# Patient Record
Sex: Male | Born: 1975 | Race: White | Hispanic: No | Marital: Married | Smoking: Never smoker
Health system: Southern US, Community
[De-identification: ages and names within clinical notes are randomized; demographics above are authoritative.]

## PROBLEM LIST (undated history)

## (undated) DIAGNOSIS — Q2381 Bicuspid aortic valve: Secondary | ICD-10-CM

## (undated) DIAGNOSIS — R011 Cardiac murmur, unspecified: Secondary | ICD-10-CM

## (undated) DIAGNOSIS — K37 Unspecified appendicitis: Secondary | ICD-10-CM

## (undated) DIAGNOSIS — Q231 Congenital insufficiency of aortic valve: Secondary | ICD-10-CM

## (undated) DIAGNOSIS — D649 Anemia, unspecified: Secondary | ICD-10-CM

---

## 2017-07-14 ENCOUNTER — Encounter: Payer: Self-pay | Admitting: Emergency Medicine

## 2017-07-14 ENCOUNTER — Other Ambulatory Visit: Payer: Self-pay

## 2017-07-14 DIAGNOSIS — A419 Sepsis, unspecified organism: Secondary | ICD-10-CM | POA: Diagnosis not present

## 2017-07-14 DIAGNOSIS — K3533 Acute appendicitis with perforation and localized peritonitis, with abscess: Secondary | ICD-10-CM | POA: Diagnosis present

## 2017-07-14 DIAGNOSIS — R1084 Generalized abdominal pain: Secondary | ICD-10-CM | POA: Diagnosis not present

## 2017-07-14 DIAGNOSIS — Q231 Congenital insufficiency of aortic valve: Secondary | ICD-10-CM

## 2017-07-14 DIAGNOSIS — I959 Hypotension, unspecified: Secondary | ICD-10-CM | POA: Diagnosis present

## 2017-07-14 LAB — CBC
HEMATOCRIT: 42.6 % (ref 40.0–52.0)
HEMOGLOBIN: 14.3 g/dL (ref 13.0–18.0)
MCH: 29.4 pg (ref 26.0–34.0)
MCHC: 33.6 g/dL (ref 32.0–36.0)
MCV: 87.7 fL (ref 80.0–100.0)
PLATELETS: 266 10*3/uL (ref 150–440)
RBC: 4.86 MIL/uL (ref 4.40–5.90)
RDW: 13 % (ref 11.5–14.5)
WBC: 10.4 10*3/uL (ref 3.8–10.6)

## 2017-07-14 NOTE — ED Triage Notes (Signed)
Patient ambulatory to triage with steady gait, without difficulty or distress noted; pt reports right sided abd pain x 4-5 days accomp by nausea

## 2017-07-15 ENCOUNTER — Inpatient Hospital Stay: Payer: BLUE CROSS/BLUE SHIELD

## 2017-07-15 ENCOUNTER — Emergency Department: Payer: BLUE CROSS/BLUE SHIELD

## 2017-07-15 ENCOUNTER — Inpatient Hospital Stay
Admission: EM | Admit: 2017-07-15 | Discharge: 2017-07-18 | DRG: 871 | Disposition: A | Discharge status: Home / Self Care | Payer: BLUE CROSS/BLUE SHIELD | Attending: Surgery | Admitting: Surgery

## 2017-07-15 ENCOUNTER — Other Ambulatory Visit: Payer: Self-pay

## 2017-07-15 DIAGNOSIS — A419 Sepsis, unspecified organism: Secondary | ICD-10-CM | POA: Diagnosis present

## 2017-07-15 DIAGNOSIS — Q231 Congenital insufficiency of aortic valve: Secondary | ICD-10-CM | POA: Diagnosis not present

## 2017-07-15 DIAGNOSIS — I959 Hypotension, unspecified: Secondary | ICD-10-CM | POA: Diagnosis present

## 2017-07-15 DIAGNOSIS — K3532 Acute appendicitis with perforation and localized peritonitis, without abscess: Secondary | ICD-10-CM | POA: Diagnosis present

## 2017-07-15 DIAGNOSIS — K3533 Acute appendicitis with perforation and localized peritonitis, with abscess: Secondary | ICD-10-CM

## 2017-07-15 DIAGNOSIS — R1084 Generalized abdominal pain: Secondary | ICD-10-CM | POA: Diagnosis present

## 2017-07-15 HISTORY — DX: Congenital insufficiency of aortic valve: Q23.1

## 2017-07-15 HISTORY — DX: Bicuspid aortic valve: Q23.81

## 2017-07-15 LAB — COMPREHENSIVE METABOLIC PANEL
ALT: 18 U/L (ref 17–63)
AST: 26 U/L (ref 15–41)
Albumin: 4.2 g/dL (ref 3.5–5.0)
Alkaline Phosphatase: 62 U/L (ref 38–126)
Anion gap: 10 (ref 5–15)
BUN: 11 mg/dL (ref 6–20)
CHLORIDE: 103 mmol/L (ref 101–111)
CO2: 26 mmol/L (ref 22–32)
Calcium: 9.5 mg/dL (ref 8.9–10.3)
Creatinine, Ser: 0.99 mg/dL (ref 0.61–1.24)
GFR calc Af Amer: >60 mL/min (ref >60)
GFR calc non Af Amer: >60 mL/min (ref >60)
Glucose, Bld: 99 mg/dL (ref 65–99)
POTASSIUM: 4 mmol/L (ref 3.5–5.1)
SODIUM: 139 mmol/L (ref 135–145)
Total Bilirubin: 0.8 mg/dL (ref 0.3–1.2)
Total Protein: 7.8 g/dL (ref 6.5–8.1)

## 2017-07-15 LAB — URINALYSIS, COMPLETE (UACMP) WITH MICROSCOPIC
BACTERIA UA: NONE SEEN
Bilirubin Urine: NEGATIVE
GLUCOSE, UA: NEGATIVE mg/dL
HGB URINE DIPSTICK: NEGATIVE
KETONES UR: 5 mg/dL — AB
Leukocytes, UA: NEGATIVE
NITRITE: NEGATIVE
PROTEIN: NEGATIVE mg/dL
RBC / HPF: NONE SEEN RBC/hpf (ref 0–5)
Specific Gravity, Urine: 1.011 (ref 1.005–1.030)
Squamous Epithelial / LPF: NONE SEEN
WBC UA: NONE SEEN WBC/hpf (ref 0–5)
pH: 6 (ref 5.0–8.0)

## 2017-07-15 LAB — LIPASE, BLOOD: LIPASE: 24 U/L (ref 11–51)

## 2017-07-15 MED ORDER — KETOROLAC TROMETHAMINE 30 MG/ML IJ SOLN
30.0000 mg | Freq: Four times a day (QID) | INTRAMUSCULAR | Status: DC
  Administered 2017-07-15 – 2017-07-18 (×13): 30 mg via INTRAVENOUS
  Filled 2017-07-15 (×13): qty 1

## 2017-07-15 MED ORDER — MORPHINE SULFATE (PF) 2 MG/ML IV SOLN: 2.0000 mg | Freq: Once | INTRAVENOUS | Status: DC

## 2017-07-15 MED ORDER — SODIUM CHLORIDE 0.9% FLUSH
10.0000 mL | Freq: Three times a day (TID) | INTRAVENOUS | Status: DC
  Administered 2017-07-15 – 2017-07-18 (×8): 10 mL

## 2017-07-15 MED ORDER — FENTANYL CITRATE (PF) 100 MCG/2ML IJ SOLN
INTRAMUSCULAR | Status: AC | PRN
  Administered 2017-07-15 (×2): 25 ug via INTRAVENOUS
  Administered 2017-07-15: 50 ug via INTRAVENOUS

## 2017-07-15 MED ORDER — MIDAZOLAM HCL 5 MG/5ML IJ SOLN
INTRAMUSCULAR | Status: AC
  Filled 2017-07-15: qty 5

## 2017-07-15 MED ORDER — PANTOPRAZOLE SODIUM 40 MG IV SOLR
40.0000 mg | Freq: Every day | INTRAVENOUS | Status: DC
  Administered 2017-07-15: 40 mg via INTRAVENOUS
  Filled 2017-07-15: qty 40

## 2017-07-15 MED ORDER — HYDROMORPHONE HCL 1 MG/ML IJ SOLN
0.5000 mg | INTRAMUSCULAR | Status: DC | PRN
  Administered 2017-07-15: 0.5 mg via INTRAVENOUS
  Filled 2017-07-15: qty 0.5

## 2017-07-15 MED ORDER — MIDAZOLAM HCL 5 MG/5ML IJ SOLN
INTRAMUSCULAR | Status: AC | PRN
  Administered 2017-07-15 (×4): 1 mg via INTRAVENOUS

## 2017-07-15 MED ORDER — ACETAMINOPHEN 500 MG PO TABS
1000.0000 mg | ORAL_TABLET | Freq: Four times a day (QID) | ORAL | Status: DC | PRN
  Administered 2017-07-15 – 2017-07-16 (×3): 1000 mg via ORAL
  Filled 2017-07-15 (×4): qty 2

## 2017-07-15 MED ORDER — ONDANSETRON HCL 4 MG/2ML IJ SOLN
4.0000 mg | Freq: Four times a day (QID) | INTRAMUSCULAR | Status: DC | PRN
  Administered 2017-07-15 – 2017-07-16 (×2): 4 mg via INTRAVENOUS
  Filled 2017-07-15 (×2): qty 2

## 2017-07-15 MED ORDER — FENTANYL CITRATE (PF) 100 MCG/2ML IJ SOLN
INTRAMUSCULAR | Status: AC
  Filled 2017-07-15: qty 4

## 2017-07-15 MED ORDER — LACTATED RINGERS IV SOLN
125.0000 mL/h | INTRAVENOUS | Status: DC
  Administered 2017-07-15: 125 mL/h via INTRAVENOUS

## 2017-07-15 MED ORDER — ENOXAPARIN SODIUM 40 MG/0.4ML ~~LOC~~ SOLN
40.0000 mg | SUBCUTANEOUS | Status: DC
  Administered 2017-07-16 – 2017-07-17 (×3): 40 mg via SUBCUTANEOUS
  Filled 2017-07-15 (×3): qty 0.4

## 2017-07-15 MED ORDER — LACTATED RINGERS IV BOLUS (SEPSIS)
1000.0000 mL | Freq: Once | INTRAVENOUS | Status: AC
  Administered 2017-07-15: 1000 mL via INTRAVENOUS

## 2017-07-15 MED ORDER — SODIUM CHLORIDE 0.9% FLUSH: 10.0000 mL | Freq: Three times a day (TID) | INTRAVENOUS | Status: DC

## 2017-07-15 MED ORDER — LIDOCAINE HCL (PF) 1 % IJ SOLN
INTRAMUSCULAR | Status: AC | PRN
  Administered 2017-07-15: 8 mL

## 2017-07-15 MED ORDER — ONDANSETRON HCL 4 MG/2ML IJ SOLN
4.0000 mg | Freq: Once | INTRAMUSCULAR | Status: DC
Start: 2017-07-15 — End: 2017-07-18

## 2017-07-15 MED ORDER — ONDANSETRON 4 MG PO TBDP: 4.0000 mg | ORAL_TABLET | Freq: Four times a day (QID) | ORAL | Status: DC | PRN

## 2017-07-15 MED ORDER — SODIUM CHLORIDE 0.9 % IV SOLN
INTRAVENOUS | Status: DC
  Administered 2017-07-15 – 2017-07-16 (×2): via INTRAVENOUS

## 2017-07-15 MED ORDER — IOPAMIDOL (ISOVUE-300) INJECTION 61%
100.0000 mL | Freq: Once | INTRAVENOUS | Status: AC | PRN
  Administered 2017-07-15: 100 mL via INTRAVENOUS

## 2017-07-15 MED ORDER — PIPERACILLIN-TAZOBACTAM 3.375 G IVPB
3.3750 g | Freq: Three times a day (TID) | INTRAVENOUS | Status: DC
  Administered 2017-07-15 – 2017-07-18 (×9): 3.375 g via INTRAVENOUS
  Filled 2017-07-15 (×10): qty 50

## 2017-07-15 MED ORDER — PIPERACILLIN-TAZOBACTAM 3.375 G IVPB 30 MIN
3.3750 g | Freq: Once | INTRAVENOUS | Status: AC
  Administered 2017-07-15: 3.375 g via INTRAVENOUS
  Filled 2017-07-15: qty 50

## 2017-07-15 NOTE — ED Provider Notes (Signed)
[facility] Emergency Department Provider Note   First MD Initiated Contact with Patient 07/15/17 [phone]     (approximate)  I have reviewed the triage vital signs and the nursing notes.   HISTORY  Chief Complaint Abdominal Pain    HPI Edward Kaufman is a 42 y.o. male to the emergency department 1 week history of right side abdominal pain associate with nausea however no vomiting.  Patient denies any fever or diarrhea.  Patient denies any urinary symptoms no dysuria hematuria increased frequency or urgency.  Patient denies any constipation.  Patient states that his current pain score 6 out of 10.  Patient denies any aggravating or alleviating factors for his pain.  Patient denies any previous abdominal surgery   Past medical history None There are no active problems to display for this patient.   Past surgical history None  Prior to Admission medications   Not on File    Allergies Patient has no known allergies.  No family history on file.  Social History Social History   Tobacco Use  . Smoking status: Never Smoker  . Smokeless tobacco: Never Used  Substance Use Topics  . Alcohol use: Not on file  . Drug use: Not on file    Review of Systems Constitutional: No fever/chills Eyes: No visual changes. ENT: No sore throat. Cardiovascular: Denies chest pain. Respiratory: Denies shortness of breath. Gastrointestinal: Positive for abdominal pain and nausea Genitourinary: Negative for dysuria. Musculoskeletal: Negative for neck pain.  Negative for back pain. Integumentary: Negative for rash. Neurological: Negative for headaches, focal weakness or numbness.   ____________________________________________   PHYSICAL EXAM:  VITAL SIGNS: ED Triage Vitals  Enc Vitals Group     BP 07/14/17 2348 131/73     Pulse Rate 07/14/17 2348 76     Resp 07/14/17 2348 18     Temp 07/14/17 2348 99.2 F (37.3 C)     Temp Source 07/14/17 2348 Oral      SpO2 07/14/17 2348 99 %     Weight 07/14/17 2347 99.8 kg (220 lb)     Height 07/14/17 2347 1.88 m (6\' 2" )     Head Circumference --      Peak Flow --      Pain Score 07/14/17 2347 6     Pain Loc --      Pain Edu? --      Excl. in GC? --     Constitutional: Alert and oriented. Well appearing and in no acute distress. Eyes: Conjunctivae are normal.  Head: Atraumatic. Mouth/Throat: Mucous membranes are moist. Oropharynx non-erythematous. Neck: No stridor.   Cardiovascular: Normal rate, regular rhythm. Good peripheral circulation. Grossly normal heart sounds. Respiratory: Normal respiratory effort.  No retractions. Lungs CTAB. Gastrointestinal: Right upper quadrant/right lower quadrant tenderness to palpation.  No distention.  Musculoskeletal: No lower extremity tenderness nor edema. No gross deformities of extremities. Neurologic:  Normal speech and language. No gross focal neurologic deficits are appreciated.  Skin:  Skin is warm, dry and intact. No rash noted. Psychiatric: Mood and affect are normal. Speech and behavior are normal. ____________________________________________   LABS (all labs ordered are listed, but only abnormal results are displayed)  Labs Reviewed  URINALYSIS, COMPLETE (UACMP) WITH MICROSCOPIC - Abnormal; Notable for the following components:      Result Value   Color, Urine YELLOW (*)    APPearance CLEAR (*)    Ketones, ur 5 (*)    All other components within normal limits  LIPASE,  BLOOD  COMPREHENSIVE METABOLIC PANEL  CBC    RADIOLOGY I, [place] Consuelo Suthers, personally viewed and evaluated these images (plain radiographs) as part of my medical decision making, as well as reviewing the written report by the radiologist.    Official radiology report(s): Ct Abdomen Pelvis W Contrast  Result Date: 07/15/2017 CLINICAL DATA:  Acute onset of generalized abdominal pain and nausea. Chills. EXAM: CT ABDOMEN AND PELVIS WITH CONTRAST TECHNIQUE: Multidetector CT  imaging of the abdomen and pelvis was performed using the standard protocol following bolus administration of intravenous contrast. CONTRAST:  ISOVUE-300 IOPAMIDOL (ISOVUE-300) INJECTION 61% COMPARISON:  None. FINDINGS: Lower chest: The visualized lung bases are grossly clear. The visualized portions of the mediastinum are unremarkable. Hepatobiliary: The liver is unremarkable in appearance. The gallbladder is unremarkable in appearance. The common bile duct remains normal in caliber. Pancreas: The pancreas is within normal limits. Spleen: The spleen is unremarkable in appearance. Adrenals/Urinary Tract: The adrenal glands are unremarkable in appearance. The kidneys are within normal limits. There is no evidence of hydronephrosis. No renal or ureteral stones are identified. No perinephric stranding is seen. Stomach/Bowel: There is a focal 5.0 x 3.6 x 3.0 cm peripherally enhancing abscess at the upper right hemipelvis, with diffuse surrounding soft tissue inflammation. This most likely reflects a perforated appendicitis. No free air is seen. An appendicolith is suggested adjacent to the collection, measuring 4 mm in size. The appendix is not well characterized due to the abscess. Appendix: Location: Upper right hemipelvis, medial to the cecum. Diameter: Not well seen. Appendicolith: Yes Mucosal hyper-enhancement: Yes Extraluminal gas: No Periappendiceal collection: Yes The colon is unremarkable in appearance. Trace free fluid is noted within the pelvis. The small bowel is grossly unremarkable. The stomach is within normal limits. Vascular/Lymphatic: The abdominal aorta is unremarkable in appearance. The inferior vena cava is grossly unremarkable. No retroperitoneal lymphadenopathy is seen. No pelvic sidewall lymphadenopathy is identified. Reproductive: The bladder is moderately distended and grossly unremarkable. The prostate remains normal in size. Other: No additional soft tissue abnormalities are seen.  Musculoskeletal: No acute osseous abnormalities are identified. The visualized musculature is unremarkable in appearance. IMPRESSION: 5.0 x 3.6 x 3.0 cm peripherally enhancing abscess in the upper right hemipelvis, with diffuse surrounding soft tissue inflammation. This most likely reflects a perforated appendicitis. Adjacent appendicolith suggested, measuring 4 mm in size. No free intraperitoneal air seen. Critical Value/emergent results were called by telephone at the time of interpretation on 07/15/2017 at 5:05 am to Dr. Bayard Males, who verbally acknowledged these results. Electronically Signed   By: Roanna Raider M.D.   On: 07/15/2017 05:06     Procedures   ____________________________________________   INITIAL IMPRESSION / ASSESSMENT AND PLAN / ED COURSE  As part of my medical decision making, I reviewed the following data within the electronic MEDICAL RECORD [id number] year old male presented with above-stated history and physical exam of abdominal pain times 1 week.  Concern for possible intra-abdominal pathology including appendicitis cholelithiasis or cholecystitis and as such CT scan of the abdomen was performed CT scan findings consistent with perforated appendicitis with abscess.  Patient discussed with Dr. Cherly Hensen radiologist as well as Dr. Aleen Campi general surgery. ____________________________________________  FINAL CLINICAL IMPRESSION(S) / ED DIAGNOSES  Final diagnoses:  Perforated appendicitis     MEDICATIONS GIVEN DURING THIS VISIT:  Medications  piperacillin-tazobactam (ZOSYN) IVPB 3.375 g (3.375 g Intravenous New Bag/Given 07/15/17 0522)  iopamidol (ISOVUE-300) 61 % injection 100 mL (100 mLs Intravenous Contrast Given 07/15/17 0441)  ED Discharge Orders    None       Note:  This document was prepared using Dragon voice recognition software and may include unintentional dictation errors.    Darci Current, MD 07/15/17 [phone]

## 2017-07-15 NOTE — ED Notes (Signed)
Pt in with initially diffuse abd pain that is now 6/10 right sided aching. Endorses nausea, denies vomiting. Reports having chills and feeling warm but did not measure temperature at home.

## 2017-07-15 NOTE — H&P (Signed)
Date of Admission:  07/15/2017  Reason for Admission:  Perforated appendicitis  History of Present Illness: Edward Kaufman is a 42 y.o. male who presents with a 6 day history of abdominal pain.  He initially started having pain on Friday 2/1 and he thought he may have a hernia or pulled a groin muscle.  He was able to go for a 5-6 mile run the next day.  However by 2/4 he was not able to run due to the pain and then on 2/6, he started having chills and nausea.  Denies any vomiting or fevers.  The pain has been vaguely diffuse at first and now more localized to the right lower quadrant.  He has been able to eat.  Nothing at home improved the pain.  In the ED, workup reveals normal labs overall with WBC 10.4, but CT does show perforated appendicitis with a 5 x 3.6 x 3 cm abscess.  He does have a history of bicuspid aortic valve and is set to see an internist for continued management next month.  He had a stress echo about 1.5 years ago which per patient was normal.  He just moved to the area.  Past Medical History: Past Medical History:  Diagnosis Date  . Bicuspid aortic valve      Past Surgical History: --None  Home Medications: Prior to Admission medications   None    Allergies: No Known Allergies  Social History:  reports that  has never smoked. he has never used smokeless tobacco. He reports that he does not drink alcohol or use drugs.   Family History: Family History  Problem Relation Age of Onset  . Healthy Neg Hx     Review of Systems: Review of Systems  Constitutional: Positive for chills. Negative for fever.  HENT: Negative for hearing loss.   Respiratory: Negative for shortness of breath.   Cardiovascular: Negative for chest pain.  Gastrointestinal: Positive for abdominal pain and nausea. Negative for constipation, diarrhea and vomiting.  Genitourinary: Negative for dysuria.  Musculoskeletal: Negative for myalgias.  Skin: Negative for rash.  Neurological: Negative  for dizziness.  Psychiatric/Behavioral: Negative for depression.  All other systems reviewed and are negative.   Physical Exam BP 121/81   Pulse 64   Temp 98.6 F (37 C) (Oral)   Resp 18   Ht 6\' 2"  (1.88 m)   Wt 99.8 kg (220 lb)   SpO2 100%   BMI 28.25 kg/m  CONSTITUTIONAL: No acute distress HEENT:  Normocephalic, atraumatic, extraocular motion intact. NECK: Trachea is midline, and there is no jugular venous distension.  RESPIRATORY:  Lungs are clear, and breath sounds are equal bilaterally. Normal respiratory effort without pathologic use of accessory muscles. CARDIOVASCULAR: Heart is regular without murmurs, gallops, or rubs. GI: The abdomen is soft, non-distended, tender to palpation in the right lower quadrant. No peritoneal signs. There were no palpable masses.  MUSCULOSKELETAL:  Normal muscle strength and tone in all four extremities.  No peripheral edema or cyanosis. SKIN: Skin turgor is normal. There are no pathologic skin lesions.  NEUROLOGIC:  Motor and sensation is grossly normal.  Cranial nerves are grossly intact. PSYCH:  Alert and oriented to person, place and time. Affect is normal.  Laboratory Analysis: Results for orders placed or performed during the hospital encounter of 07/15/17 (from the past 24 hour(s))  Lipase, blood     Status: None   Collection Time: 07/14/17 11:43 PM  Result Value Ref Range   Lipase 24 11 - 51  U/L  Comprehensive metabolic panel     Status: None   Collection Time: 07/14/17 11:43 PM  Result Value Ref Range   Sodium 139 135 - 145 mmol/L   Potassium 4.0 3.5 - 5.1 mmol/L   Chloride 103 101 - 111 mmol/L   CO2 26 22 - 32 mmol/L   Glucose, Bld 99 65 - 99 mg/dL   BUN 11 6 - 20 mg/dL   Creatinine, Ser 9.60 0.61 - 1.24 mg/dL   Calcium 9.5 8.9 - 45.4 mg/dL   Total Protein 7.8 6.5 - 8.1 g/dL   Albumin 4.2 3.5 - 5.0 g/dL   AST 26 15 - 41 U/L   ALT 18 17 - 63 U/L   Alkaline Phosphatase 62 38 - 126 U/L   Total Bilirubin 0.8 0.3 - 1.2 mg/dL    GFR calc non Af Amer >60 >60 mL/min   GFR calc Af Amer >60 >60 mL/min   Anion gap 10 5 - 15  CBC     Status: None   Collection Time: 07/14/17 11:43 PM  Result Value Ref Range   WBC 10.4 3.8 - 10.6 K/uL   RBC 4.86 4.40 - 5.90 MIL/uL   Hemoglobin 14.3 13.0 - 18.0 g/dL   HCT 09.8 11.9 - 14.7 %   MCV 87.7 80.0 - 100.0 fL   MCH 29.4 26.0 - 34.0 pg   MCHC 33.6 32.0 - 36.0 g/dL   RDW 82.9 56.2 - 13.0 %   Platelets 266 150 - 440 K/uL  Urinalysis, Complete w Microscopic     Status: Abnormal   Collection Time: 07/14/17 11:43 PM  Result Value Ref Range   Color, Urine YELLOW (A) YELLOW   APPearance CLEAR (A) CLEAR   Specific Gravity, Urine 1.011 1.005 - 1.030   pH 6.0 5.0 - 8.0   Glucose, UA NEGATIVE NEGATIVE mg/dL   Hgb urine dipstick NEGATIVE NEGATIVE   Bilirubin Urine NEGATIVE NEGATIVE   Ketones, ur 5 (A) NEGATIVE mg/dL   Protein, ur NEGATIVE NEGATIVE mg/dL   Nitrite NEGATIVE NEGATIVE   Leukocytes, UA NEGATIVE NEGATIVE   RBC / HPF NONE SEEN 0 - 5 RBC/hpf   WBC, UA NONE SEEN 0 - 5 WBC/hpf   Bacteria, UA NONE SEEN NONE SEEN   Squamous Epithelial / LPF NONE SEEN NONE SEEN   Mucus PRESENT     Imaging: Ct Abdomen Pelvis W Contrast  Result Date: 07/15/2017 CLINICAL DATA:  Acute onset of generalized abdominal pain and nausea. Chills. EXAM: CT ABDOMEN AND PELVIS WITH CONTRAST TECHNIQUE: Multidetector CT imaging of the abdomen and pelvis was performed using the standard protocol following bolus administration of intravenous contrast. CONTRAST:  ISOVUE-300 IOPAMIDOL (ISOVUE-300) INJECTION 61% COMPARISON:  None. FINDINGS: Lower chest: The visualized lung bases are grossly clear. The visualized portions of the mediastinum are unremarkable. Hepatobiliary: The liver is unremarkable in appearance. The gallbladder is unremarkable in appearance. The common bile duct remains normal in caliber. Pancreas: The pancreas is within normal limits. Spleen: The spleen is unremarkable in appearance.  Adrenals/Urinary Tract: The adrenal glands are unremarkable in appearance. The kidneys are within normal limits. There is no evidence of hydronephrosis. No renal or ureteral stones are identified. No perinephric stranding is seen. Stomach/Bowel: There is a focal 5.0 x 3.6 x 3.0 cm peripherally enhancing abscess at the upper right hemipelvis, with diffuse surrounding soft tissue inflammation. This most likely reflects a perforated appendicitis. No free air is seen. An appendicolith is suggested adjacent to the collection, measuring 4  mm in size. The appendix is not well characterized due to the abscess. Appendix: Location: Upper right hemipelvis, medial to the cecum. Diameter: Not well seen. Appendicolith: Yes Mucosal hyper-enhancement: Yes Extraluminal gas: No Periappendiceal collection: Yes The colon is unremarkable in appearance. Trace free fluid is noted within the pelvis. The small bowel is grossly unremarkable. The stomach is within normal limits. Vascular/Lymphatic: The abdominal aorta is unremarkable in appearance. The inferior vena cava is grossly unremarkable. No retroperitoneal lymphadenopathy is seen. No pelvic sidewall lymphadenopathy is identified. Reproductive: The bladder is moderately distended and grossly unremarkable. The prostate remains normal in size. Other: No additional soft tissue abnormalities are seen. Musculoskeletal: No acute osseous abnormalities are identified. The visualized musculature is unremarkable in appearance. IMPRESSION: 5.0 x 3.6 x 3.0 cm peripherally enhancing abscess in the upper right hemipelvis, with diffuse surrounding soft tissue inflammation. This most likely reflects a perforated appendicitis. Adjacent appendicolith suggested, measuring 4 mm in size. No free intraperitoneal air seen. Critical Value/emergent results were called by telephone at the time of interpretation on 07/15/2017 at 5:05 am to Dr. Bayard MalesANDOLPH BROWN, who verbally acknowledged these results. Electronically  Signed   By: Roanna RaiderJeffery  Chang M.D.   On: 07/15/2017 05:06    Assessment and Plan: This is a 42 y.o. male who presents with perforated appendicitis with an abscess.  I have independently viewed the patient's imaging study and reviewed his laboratory studies.  Overall, the patient does have perforated appendicitis on CT with a 5 x 3.6 x 3 cm abscess next to the appendix and surrounding inflammation changes.  There is likely an appendicolith as well.  Discussed with the patient that he will be admitted to the surgical team.  At this point we would start non-operative management and discuss with IR to drain his abscess today.  Discussed with patient that he would have a drain in place for several days and be started on IV antibiotics here and transitioned to oral for home discharge, which likely would be in a few days.  Discussed the need to be NPO for now and then start advancing his diet based on his symptoms and pain.  He will have IV fluids for hydration and appropriate pain and nausea control.  Likely would anticipate a weekend discharge.  Surgery for his appendicitis would be in the future after the inflammation has subsided and would discuss need for colonoscopy as well prior to surgery.  Patient understands this plan and all of his questions have been answered.   Howie IllJose Luis Leauna Sharber, MD [facility]

## 2017-07-15 NOTE — Progress Notes (Signed)
Dr. Aleen CampiPiscoya notified of fever 103.1, BP 92/49; acknowledged; new orders written; Windy Carinaurner,Loralee Weitzman K, RN; 10:52 PM 07/15/2017

## 2017-07-15 NOTE — Procedures (Signed)
Interventional Radiology Procedure Note  Procedure: CT guided percutaneous drainage of appendiceal abscess  Complications: None  Estimated Blood Loss: < 10 mL  Findings: Aspiration of abscess yielded purulent fluid; sample sent for culture. 10 Fr pigtail drainage catheter placed.  Attached to suction bulb.  Jodi MarbleGlenn T. Fredia SorrowYamagata, M.D Pager:  [phone]

## 2017-07-15 NOTE — Progress Notes (Signed)
SURGICAL PROGRESS NOTE (cpt [phone])  Hospital Day(s): 0.   Post op day(s):  Marland Kitchen.   Interval History: Patient seen and examined, no acute events or new complaints since admission overnight. Patient reports significant improvement of his pain and, since admission, denies any N/V, fever/chills, CP, or SOB.  Review of Systems:  Constitutional: denies fever, chills  HEENT: denies cough or congestion  Respiratory: denies any shortness of breath  Cardiovascular: denies chest pain or palpitations  Gastrointestinal: abdominal pain, N/V, and bowel function as per interval history Genitourinary: denies burning with urination or urinary frequency Musculoskeletal: denies pain, decreased motor or sensation Integumentary: denies any other rashes or skin discolorations Neurological: denies HA or vision/hearing changes   Vital signs in last 24 hours: [min-max] current  Temp:  [97.8 F (36.6 C)-99.2 F (37.3 C)] 97.8 F (36.6 C) (02/07 0622) Pulse Rate:  [58-76] 63 (02/07 0622) Resp:  [18] 18 (02/07 0622) BP: (113-131)/(63-81) 113/63 (02/07 0622) SpO2:  [96 %-100 %] 98 % (02/07 0622) Weight:  [220 lb (99.8 kg)] 220 lb (99.8 kg) (02/07 0400)     Height: 6\' 2"  (188 cm) Weight: 220 lb (99.8 kg) BMI (Calculated): 28.23   Intake/Output this shift:  No intake/output data recorded.   Intake/Output last 2 shifts:  @IOLAST2SHIFTS @   Physical Exam:  Constitutional: alert, cooperative and no distress  HENT: normocephalic without obvious abnormality  Eyes: PERRL, EOM's grossly intact and symmetric  Neuro: CN II - XII grossly intact and symmetric without deficit  Respiratory: breathing non-labored at rest  Cardiovascular: regular rate and sinus rhythm  Gastrointestinal: soft and non-distended with mild RLQ tenderness to palpation Musculoskeletal: UE and LE FROM, no edema or wounds, motor and sensation grossly intact, NT   Labs:  CBC Latest Ref Rng & Units 07/14/2017  WBC 3.8 - 10.6 K/uL 10.4   Hemoglobin 13.0 - 18.0 g/dL 19.114.3  Hematocrit 47.840.0 - 52.0 % 42.6  Platelets 150 - 440 K/uL 266   CMP Latest Ref Rng & Units 07/14/2017  Glucose 65 - 99 mg/dL 99  BUN 6 - 20 mg/dL 11  Creatinine 2.950.61 - 6.211.24 mg/dL 3.080.99  Sodium 657135 - 846145 mmol/L 139  Potassium 3.5 - 5.1 mmol/L 4.0  Chloride 101 - 111 mmol/L 103  CO2 22 - 32 mmol/L 26  Calcium 8.9 - 10.3 mg/dL 9.5  Total Protein 6.5 - 8.1 g/dL 7.8  Total Bilirubin 0.3 - 1.2 mg/dL 0.8  Alkaline Phos 38 - 126 U/L 62  AST 15 - 41 U/L 26  ALT 17 - 63 U/L 18   Imaging studies: No new pertinent imaging studies, though admission CT personally reviewed and discussed with patient   Assessment/Plan: (ICD-10's: K35.33) 42 y.o. male with perforated appendicitis with 5 cm x 3.6 cm x 3 cm peri-appendiceal abscess, complicated by pertinent comorbidities including only a bicuspid aortic valve.   - NPO for now, IVF  - IV antibiotics, pain control as needed  - monitor abdominal exam and bowel function  - IR evaluation for image-guided abscess drainage  - okay to start clear liquids diet after abscess drainage   - DVT prophylaxis and ambulation encouraged   All of the above findings and recommendations were discussed with the patient, and all of patient's questions were answered to his expressed satisfaction.  -- Scherrie GerlachJason E. Earlene Plateravis, MD, RPVI [place]: [facility] General Surgery - Partnering for exceptional care. Office: [phone]

## 2017-07-15 NOTE — ED Notes (Signed)
ED Provider at bedside. 

## 2017-07-16 LAB — CBC WITH DIFFERENTIAL/PLATELET
Basophils Absolute: 0 10*3/uL (ref 0–0.1)
Basophils Relative: 0 %
EOS ABS: 0 10*3/uL (ref 0–0.7)
EOS PCT: 0 %
HCT: 40.2 % (ref 40.0–52.0)
Hemoglobin: 13.4 g/dL (ref 13.0–18.0)
LYMPHS PCT: 5 %
Lymphs Abs: 0.7 10*3/uL — ABNORMAL LOW (ref 1.0–3.6)
MCH: 29.6 pg (ref 26.0–34.0)
MCHC: 33.4 g/dL (ref 32.0–36.0)
MCV: 88.5 fL (ref 80.0–100.0)
MONO ABS: 0.8 10*3/uL (ref 0.2–1.0)
Monocytes Relative: 5 %
Neutro Abs: 14.4 10*3/uL — ABNORMAL HIGH (ref 1.4–6.5)
Neutrophils Relative %: 90 %
PLATELETS: 246 10*3/uL (ref 150–440)
RBC: 4.54 MIL/uL (ref 4.40–5.90)
RDW: 13.1 % (ref 11.5–14.5)
WBC: 16 10*3/uL — AB (ref 3.8–10.6)

## 2017-07-16 LAB — BASIC METABOLIC PANEL
Anion gap: 9 (ref 5–15)
BUN: 15 mg/dL (ref 6–20)
CALCIUM: 8.7 mg/dL — AB (ref 8.9–10.3)
CHLORIDE: 105 mmol/L (ref 101–111)
CO2: 26 mmol/L (ref 22–32)
CREATININE: 1.15 mg/dL (ref 0.61–1.24)
GFR calc Af Amer: >60 mL/min (ref >60)
GFR calc non Af Amer: >60 mL/min (ref >60)
Glucose, Bld: 109 mg/dL — ABNORMAL HIGH (ref 65–99)
Potassium: 4.8 mmol/L (ref 3.5–5.1)
Sodium: 140 mmol/L (ref 135–145)

## 2017-07-16 LAB — HIV ANTIBODY (ROUTINE TESTING W REFLEX): HIV Screen 4th Generation wRfx: NONREACTIVE

## 2017-07-16 LAB — MAGNESIUM: Magnesium: 1.8 mg/dL (ref 1.7–2.4)

## 2017-07-16 MED ORDER — LACTATED RINGERS IV BOLUS (SEPSIS)
1000.0000 mL | Freq: Once | INTRAVENOUS | Status: AC
Start: 2017-07-16 — End: 2017-07-16
  Administered 2017-07-16: 1000 mL via INTRAVENOUS

## 2017-07-16 MED ORDER — KCL IN DEXTROSE-NACL 20-5-0.45 MEQ/L-%-% IV SOLN
INTRAVENOUS | Status: DC
  Administered 2017-07-16 – 2017-07-18 (×6): via INTRAVENOUS
  Filled 2017-07-16 (×8): qty 1000

## 2017-07-16 NOTE — Progress Notes (Signed)
SURGICAL PROGRESS NOTE (cpt [phone])  Hospital Day(s): 1.   Post op day(s):  Edward Kaufman   Interval History: Patient seen and examined, febrile and hypotension overnight, but states he feels much better this morning. Patient reports overall improved pain except focally increased at drain insertion site, denies N/V, CP, or SOB. He also says he has been ambulating and tolerating clear liquids diet with resumed flatus, though no BM yet.  Review of Systems:  Constitutional: denies fever, chills  HEENT: denies cough or congestion  Respiratory: denies any shortness of breath  Cardiovascular: denies chest pain or palpitations  Gastrointestinal: abdominal pain, N/V, and bowel function as per interval history Genitourinary: denies burning with urination or urinary frequency Musculoskeletal: denies pain, decreased motor or sensation Integumentary: denies any other rashes or skin discolorations except percutaneous drain placed yesterday Neurological: denies HA or vision/hearing changes   Vital signs in last 24 hours: [min-max] current  Temp:  [97.6 F (36.4 C)-103.1 F (39.5 C)] 98.2 F (36.8 C) (02/08 0442) Pulse Rate:  [57-89] 72 (02/08 0455) Resp:  [13-18] 17 (02/08 0442) BP: (86-119)/(42-63) 95/42 (02/08 0700) SpO2:  [93 %-100 %] 96 % (02/08 0442)     Height: 6\' 2"  (188 cm) Weight: 220 lb (99.8 kg) BMI (Calculated): 28.23   Intake/Output this shift:  No intake/output data recorded.   Intake/Output last 2 shifts:  @IOLAST2SHIFTS @   Physical Exam:  Constitutional: alert, cooperative and no distress  HENT: normocephalic without obvious abnormality  Eyes: PERRL, EOM's grossly intact and symmetric  Neuro: CN II - XII grossly intact and symmetric without deficit  Respiratory: breathing non-labored at rest  Cardiovascular: regular rate and sinus rhythm  Gastrointestinal: soft and non-distended with focal peri-drain site tenderness to palpation, no surrounding erythema Musculoskeletal: UE and LE  FROM, no edema or wounds, motor and sensation grossly intact, NT   Labs:  CBC Latest Ref Rng & Units 07/16/2017 07/14/2017  WBC 3.8 - 10.6 K/uL 16.0(H) 10.4  Hemoglobin 13.0 - 18.0 g/dL 98.1 19.1  Hematocrit 47.8 - 52.0 % 40.2 42.6  Platelets 150 - 440 K/uL 246 266   CMP Latest Ref Rng & Units 07/16/2017 07/14/2017  Glucose 65 - 99 mg/dL 295(A) 99  BUN 6 - 20 mg/dL 15 11  Creatinine 2.13 - 1.24 mg/dL 0.86 5.78  Sodium 469 - 145 mmol/L 140 139  Potassium 3.5 - 5.1 mmol/L 4.8 4.0  Chloride 101 - 111 mmol/L 105 103  CO2 22 - 32 mmol/L 26 26  Calcium 8.9 - 10.3 mg/dL 6.2(X) 9.5  Total Protein 6.5 - 8.1 g/dL - 7.8  Total Bilirubin 0.3 - 1.2 mg/dL - 0.8  Alkaline Phos 38 - 126 U/L - 62  AST 15 - 41 U/L - 26  ALT 17 - 63 U/L - 18    Assessment/Plan: (ICD-10's: K35.33) 42 y.o. male with leukocytosis and likely SIRS response 1 Day s/p image-guided abscess drainage for perforated appendicitis with 5 cm x 3.6 cm x 3 cm peri-appendiceal abscess, complicated by pertinent comorbidities including only a bicuspid aortic valve.              - IV antibiotics, pain control prn             - continue clear liquids for now, IVF             - follow-up pending abscess cultures             - repeat/trend WBC tomorrow morning             -  monitor abdominal exam and bowel function             - DVT prophylaxis and ambulation encouraged  - anticipate discharge tomorrow/Sunday  All of the above findings and recommendations were discussed with the patient, and all of patient's questions were answered to his expressed satisfaction.  -- Scherrie GerlachJason E. Earlene Plateravis, MD, RPVI [place]: [facility] - Partnering for exceptional care. Office: [phone]

## 2017-07-16 NOTE — Progress Notes (Signed)
Dr. Aleen CampiPiscoya notified of hypotension; fever broke, acknowledged; new order written for bolus. Windy Carinaurner,Rasean Joos K, RN 5:00 AM 07/16/2017

## 2017-07-16 NOTE — Progress Notes (Signed)
Chief Complaint: Patient was seen today for Follow up RLQ abscess drainage   Supervising Physician: Richarda OverlieHenn, Adam  Patient Status: [facility] - In-pt  Subjective: Pt s/p perc drain to RLQ appendiceal abscess yesterday. A bit of a rough night with fever and hypotension, c/w transient sepsis. But feeling better this am. Has been OOB, moving around. Breakfast tray in room but hasn't eaten yet. C/o soreness at drain site  Objective: Physical Exam: BP (!) 95/42 (BP Location: Right Arm)   Pulse 72   Temp 98.2 F (36.8 C) (Oral)   Resp 17   Ht 6\' 2"  (1.88 m)   Wt 220 lb (99.8 kg)   SpO2 96%   BMI 28.25 kg/m  RLQ drain intact, site clean, dry. Minimally tender Output thin cloudy bloody    Current Facility-Administered Medications:  .  acetaminophen (TYLENOL) tablet 1,000 mg, 1,000 mg, Oral, Q6H PRN, Piscoya, Jose, MD, 1,000 mg at 07/16/17 0505 .  dextrose 5 % and 0.45 % NaCl with KCl 20 mEq/L infusion, , Intravenous, Continuous, Ancil Linseyavis, Jason Evan, MD .  enoxaparin (LOVENOX) injection 40 mg, 40 mg, Subcutaneous, Q24H, Piscoya, Jose, MD, 40 mg at 07/16/17 0051 .  HYDROmorphone (DILAUDID) injection 0.5 mg, 0.5 mg, Intravenous, Q4H PRN, Piscoya, Jose, MD, 0.5 mg at 07/15/17 2127 .  ketorolac (TORADOL) 30 MG/ML injection 30 mg, 30 mg, Intravenous, Q6H, Piscoya, Jose, MD, 30 mg at 07/16/17 [id number] .  morphine 2 MG/ML injection 2 mg, 2 mg, Intravenous, Once, Darci CurrentBrown, [place] N, MD .  ondansetron (ZOFRAN-ODT) disintegrating tablet 4 mg, 4 mg, Oral, Q6H PRN **OR** ondansetron (ZOFRAN) injection 4 mg, 4 mg, Intravenous, Q6H PRN, Piscoya, Jose, MD, 4 mg at 07/15/17 2125 .  ondansetron (ZOFRAN) injection 4 mg, 4 mg, Intravenous, Once, Darci CurrentBrown, [place] N, MD .  piperacillin-tazobactam (ZOSYN) IVPB 3.375 g, 3.375 g, Intravenous, Q8H, Piscoya, Jose, MD, Last Rate: 12.5 mL/hr at 07/16/17 0637, 3.375 g at 07/16/17 0637 .  sodium chloride flush (NS) 0.9 % injection 10 mL, 10 mL, Intracatheter, Q8H, Irish LackYamagata,  Glenn, MD, 10 mL at 07/16/17 [id number]  Labs: CBC Recent Labs    07/14/17 2343 07/16/17 0446  WBC 10.4 16.0*  HGB 14.3 13.4  HCT 42.6 40.2  PLT 266 246   BMET Recent Labs    07/14/17 2343 07/16/17 0446  NA 139 140  K 4.0 4.8  CL 103 105  CO2 26 26  GLUCOSE 99 109*  BUN 11 15  CREATININE 0.99 1.15  CALCIUM 9.5 8.7*   LFT Recent Labs    07/14/17 2343  PROT 7.8  ALBUMIN 4.2  AST 26  ALT 18  ALKPHOS 62  BILITOT 0.8  LIPASE 24   PT/INR No results for input(s): LABPROT, INR in the last 72 hours.   Studies/Results: Ct Abdomen Pelvis W Contrast  Result Date: 07/15/2017 CLINICAL DATA:  Acute onset of generalized abdominal pain and nausea. Chills. EXAM: CT ABDOMEN AND PELVIS WITH CONTRAST TECHNIQUE: Multidetector CT imaging of the abdomen and pelvis was performed using the standard protocol following bolus administration of intravenous contrast. CONTRAST:  100mL ISOVUE-300 IOPAMIDOL (ISOVUE-300) INJECTION 61% COMPARISON:  None. FINDINGS: Lower chest: The visualized lung bases are grossly clear. The visualized portions of the mediastinum are unremarkable. Hepatobiliary: The liver is unremarkable in appearance. The gallbladder is unremarkable in appearance. The common bile duct remains normal in caliber. Pancreas: The pancreas is within normal limits. Spleen: The spleen is unremarkable in appearance. Adrenals/Urinary Tract: The adrenal glands are unremarkable in appearance. The  kidneys are within normal limits. There is no evidence of hydronephrosis. No renal or ureteral stones are identified. No perinephric stranding is seen. Stomach/Bowel: There is a focal 5.0 x 3.6 x 3.0 cm peripherally enhancing abscess at the upper right hemipelvis, with diffuse surrounding soft tissue inflammation. This most likely reflects a perforated appendicitis. No free air is seen. An appendicolith is suggested adjacent to the collection, measuring 4 mm in size. The appendix is not well characterized due to  the abscess. Appendix: Location: Upper right hemipelvis, medial to the cecum. Diameter: Not well seen. Appendicolith: Yes Mucosal hyper-enhancement: Yes Extraluminal gas: No Periappendiceal collection: Yes The colon is unremarkable in appearance. Trace free fluid is noted within the pelvis. The small bowel is grossly unremarkable. The stomach is within normal limits. Vascular/Lymphatic: The abdominal aorta is unremarkable in appearance. The inferior vena cava is grossly unremarkable. No retroperitoneal lymphadenopathy is seen. No pelvic sidewall lymphadenopathy is identified. Reproductive: The bladder is moderately distended and grossly unremarkable. The prostate remains normal in size. Other: No additional soft tissue abnormalities are seen. Musculoskeletal: No acute osseous abnormalities are identified. The visualized musculature is unremarkable in appearance. IMPRESSION: 5.0 x 3.6 x 3.0 cm peripherally enhancing abscess in the upper right hemipelvis, with diffuse surrounding soft tissue inflammation. This most likely reflects a perforated appendicitis. Adjacent appendicolith suggested, measuring 4 mm in size. No free intraperitoneal air seen. Critical Value/emergent results were called by telephone at the time of interpretation on 07/15/2017 at 5:05 am to Dr. Bayard Males, who verbally acknowledged these results. Electronically Signed   By: Roanna Raider M.D.   On: 07/15/2017 05:06   Ct Image Guided Drainage By Percutaneous Catheter  Result Date: 07/16/2017 CLINICAL DATA:  Ruptured appendicitis with appendiceal abscess requiring percutaneous drainage. EXAM: CT GUIDED CATHETER DRAINAGE OF APPENDICEAL ABSCESS ANESTHESIA/SEDATION: 4.0 mg IV Versed 100 mcg IV Fentanyl Total Moderate Sedation Time:  29 minutes The patient's level of consciousness and physiologic status were continuously monitored during the procedure by Radiology nursing. PROCEDURE: The procedure, risks, benefits, and alternatives were explained  to the patient. Questions regarding the procedure were encouraged and answered. The patient understands and consents to the procedure. A time out was performed prior to initiating the procedure. The right lower anterior abdominal wall was prepped with chlorhexidine in a sterile fashion, and a sterile drape was applied covering the operative field. A sterile gown and sterile gloves were used for the procedure. Local anesthesia was provided with 1% Lidocaine. CT was performed to localize a right lower quadrant appendiceal abscess. Under CT guidance an 18 gauge trocar needle was advanced into the collection. Aspiration of fluid was performed and a fluid sample sent for culture analysis. A guidewire was advanced into the collection and the needle removed. The tract was dilated and a 10 French percutaneous drainage catheter advanced over the wire. The catheter was flushed and connected to a suction bulb. It was secured at the skin with a Prolene retention suture and StatLock device. COMPLICATIONS: None FINDINGS: Aspiration at the level of the focal appendiceal abscess yielded grossly purulent fluid. After placement of the drainage catheter, there is good return of purulent fluid. IMPRESSION: CT-guided percutaneous catheter drainage of appendiceal abscess with placement of 10 French drainage catheter. The catheter was attached to suction bulb drainage. Electronically Signed   By: Irish Lack M.D.   On: 07/16/2017 08:39    Assessment/Plan: Appendicitis with abscess S/p perc drain 2/7 Discussed drain care with pt and wife. Anticipate discharge over the weekend.  Rec repeat CT when drainage minimal    LOS: 1 day   I spent a total of 15 minutes in face to face in clinical consultation, greater than 50% of which was counseling/coordinating care for appendiceal abscess drain  Brayton El PA-C 07/16/2017 9:32 AM

## 2017-07-16 NOTE — Progress Notes (Signed)
07/16/17  Patient seen early this morning.  He had episodes of hypotension and fever overnight.  His heart rate was as high as 89 but his usual resting heart rate is in the 50s.  He received two 1 L boluses of LR.  Patient reports that he felt dehydrated and had darker urine.  He had percutaneous abscess drain today for perforated appendicitis.    Exam: General:  No acute distress, afebrile now. Abd:  Soft, nondistended, with mild tenderness to palpation in right lower quadrant, particularly at the drain insertion site.  Non-peritoneal.  Likely had SIRS response from abscess drainage.  Fever broke and HR improving.  Will continue following vitals and bolus as needed.  He feels well and without any dizziness or lightheadedness.   Edward Kaufman IllJose Luis Ura Yingling, MD [facility] ASCOM 7a-7p: [phone] ASCOM 7p-7a: [phone]

## 2017-07-17 LAB — CBC
HEMATOCRIT: 36.4 % — AB (ref 40.0–52.0)
HEMOGLOBIN: 12.2 g/dL — AB (ref 13.0–18.0)
MCH: 29.9 pg (ref 26.0–34.0)
MCHC: 33.4 g/dL (ref 32.0–36.0)
MCV: 89.4 fL (ref 80.0–100.0)
Platelets: 235 10*3/uL (ref 150–440)
RBC: 4.07 MIL/uL — ABNORMAL LOW (ref 4.40–5.90)
RDW: 12.8 % (ref 11.5–14.5)
WBC: 12 10*3/uL — ABNORMAL HIGH (ref 3.8–10.6)

## 2017-07-17 NOTE — Progress Notes (Signed)
SURGICAL PROGRESS NOTE (cpt [phone])  Hospital Day(s): 2.   Post op day(s):  Marland Kitchen.   Interval History: Patient seen and examined, no acute events or new complaints overnight. Patient reports pain has improved, and he has been tolerating clear liquids diet, denies fever/chills, N/V, CP, or SOB.  Review of Systems:  Constitutional: denies fever, chills  HEENT: denies cough or congestion  Respiratory: denies any shortness of breath  Cardiovascular: denies chest pain or palpitations  Gastrointestinal: abdominal pain, N/V, and bowel function as per interval history Genitourinary: denies burning with urination or urinary frequency Musculoskeletal: denies pain, decreased motor or sensation Integumentary: denies any other rashes or skin discolorations except RLQ abscess drain Neurological: denies HA or vision/hearing changes   Vital signs in last 24 hours: [min-max] current  Temp:  [97.6 F (36.4 C)-99.9 F (37.7 C)] 99.9 F (37.7 C) (02/09 0617) Pulse Rate:  [58-70] 70 (02/09 0617) Resp:  [15-18] 16 (02/09 0617) BP: (91-101)/(48-55) 101/53 (02/09 0617) SpO2:  [96 %-98 %] 96 % (02/09 0617)     Height: 6\' 2"  (188 cm) Weight: 220 lb (99.8 kg) BMI (Calculated): 28.23   Intake/Output this shift:  No intake/output data recorded.   Intake/Output last 2 shifts:  @IOLAST2SHIFTS @   Physical Exam:  Constitutional: alert, cooperative and no distress  HENT: normocephalic without obvious abnormality  Eyes: PERRL, EOM's grossly intact and symmetric  Neuro: CN II - XII grossly intact and symmetric without deficit  Respiratory: breathing non-labored at rest  Cardiovascular: regular rate and sinus rhythm  Gastrointestinal: soft and non-distended with mild RLQ peri-drain abdominal tenderness to palpation Musculoskeletal: UE and LE FROM, no edema or wounds, motor and sensation grossly intact, NT   Labs:  CBC Latest Ref Rng & Units 07/17/2017 07/16/2017 07/14/2017  WBC 3.8 - 10.6 K/uL 12.0(H) 16.0(H) 10.4   Hemoglobin 13.0 - 18.0 g/dL 12.2(L) 13.4 14.3  Hematocrit 40.0 - 52.0 % 36.4(L) 40.2 42.6  Platelets 150 - 440 K/uL 235 246 266   CMP Latest Ref Rng & Units 07/16/2017 07/14/2017  Glucose 65 - 99 mg/dL 604(V109(H) 99  BUN 6 - 20 mg/dL 15 11  Creatinine 4.090.61 - 1.24 mg/dL 8.111.15 9.140.99  Sodium 782135 - 145 mmol/L 140 139  Potassium 3.5 - 5.1 mmol/L 4.8 4.0  Chloride 101 - 111 mmol/L 105 103  CO2 22 - 32 mmol/L 26 26  Calcium 8.9 - 10.3 mg/dL 9.5(A8.7(L) 9.5  Total Protein 6.5 - 8.1 g/dL - 7.8  Total Bilirubin 0.3 - 1.2 mg/dL - 0.8  Alkaline Phos 38 - 126 U/L - 62  AST 15 - 41 U/L - 26  ALT 17 - 63 U/L - 18   Imaging studies: No new pertinent imaging studies   Assessment/Plan:(ICD-10's: K35.33) 42 y.o.malewith leukocytosis and likely SIRS response 2 Days s/p image-guided abscess drainage for perforated appendicitis with 5 cm x 3.6 cm x 3 cm peri-appendiceal abscess, complicated by pertinent comorbidities includingonly a bicuspid aortic valve.  - IV antibiotics,pain control prn             - follow-up pending abscess cultures             - repeat/trend WBC tomorrow morning - advance diet as tolerated and heplock IVF             - discussed with patient discharge today vs tomorrow, will plan for discharge tomorrow - monitor abdominal exam and bowel function - DVT prophylaxis,ambulation encouraged  All of the above findings and recommendations were discussed  with the patient, and all of patient's questions were answered to his expressed satisfaction.  -- Scherrie Gerlach Earlene Plater, MD, RPVI [place]: [facility] General [facility] - Partnering for exceptional care. Office: [phone]

## 2017-07-18 DIAGNOSIS — K3533 Acute appendicitis with perforation and localized peritonitis, with abscess: Secondary | ICD-10-CM

## 2017-07-18 LAB — CBC
HEMATOCRIT: 35.8 % — AB (ref 40.0–52.0)
HEMOGLOBIN: 12.1 g/dL — AB (ref 13.0–18.0)
MCH: 30.2 pg (ref 26.0–34.0)
MCHC: 33.8 g/dL (ref 32.0–36.0)
MCV: 89.2 fL (ref 80.0–100.0)
Platelets: 235 10*3/uL (ref 150–440)
RBC: 4.01 MIL/uL — AB (ref 4.40–5.90)
RDW: 13.2 % (ref 11.5–14.5)
WBC: 10.5 10*3/uL (ref 3.8–10.6)

## 2017-07-18 MED ORDER — AMOXICILLIN-POT CLAVULANATE 875-125 MG PO TABS: 1.0000 | ORAL_TABLET | Freq: Two times a day (BID) | ORAL | 0 refills | Status: DC

## 2017-07-18 MED ORDER — OXYCODONE-ACETAMINOPHEN 5-325 MG PO TABS: 1.0000 | ORAL_TABLET | ORAL | 0 refills | Status: DC | PRN

## 2017-07-18 NOTE — Discharge Instructions (Signed)
In addition to included general post-operative instructions for Acute Appendicitis,  Diet: Resume home heart healthy diet.   Activity: Light activity and walking are encouraged. Do not drive or drink alcohol if taking narcotic pain medications.  Wound care: Drain care as instructed prior to discharge. Please keep a log of how much fluid you've emptied and if it looked like pus or clear/pink.  Medications: Resume all home medications. For mild to moderate pain: acetaminophen (Tylenol) or ibuprofen/naproxen (if no kidney disease). Combining Tylenol with alcohol can substantially increase your risk of causing liver disease. Narcotic pain medications, if prescribed, can be used for severe pain, though may cause nausea, constipation, and drowsiness. Do not combine Tylenol and Percocet (or similar) within a 6 hour period as Percocet (and similar) contain(s) Tylenol. If you do not need the narcotic pain medication, you do not need to fill the prescription.  Call office [phone]) at any time if any questions, worsening pain, fevers/chills, bleeding, drainage from incision site, or other concerns.

## 2017-07-18 NOTE — Progress Notes (Signed)
07/18/2017  12:20 PM  Edward Kaufman to be D/C'd Home per MD order.  Discussed prescriptions and follow up appointments with the patient. Prescriptions given to patient, medication list explained in detail. Pt verbalized understanding.  Allergies as of 07/18/2017   No Known Allergies     Medication List    TAKE these medications   amoxicillin-clavulanate 875-125 MG tablet Commonly known as:  AUGMENTIN Take 1 tablet by mouth 2 (two) times daily for 10 days.   oxyCODONE-acetaminophen 5-325 MG tablet Commonly known as:  PERCOCET/ROXICET Take 1-2 tablets by mouth every 4 (four) hours as needed for severe pain.       Vitals:   07/17/17 2018 07/18/17 0527  BP: (!) 105/55 (!) 118/59  Pulse: 66 66  Resp: 17 19  Temp: 97.7 F (36.5 C) 98.2 F (36.8 C)  SpO2: 100% 100%    Skin clean, dry and intact without evidence of skin break down, no evidence of skin tears noted. IV catheter discontinued intact. Site without signs and symptoms of complications. Dressing and pressure applied. Pt denies pain at this time. No complaints noted.  An After Visit Summary was printed and given to the patient. Patient escorted via WC, and D/C home via private auto.  Edward Kaufman, Edward Kaufman

## 2017-07-19 ENCOUNTER — Telehealth: Payer: Self-pay | Admitting: Surgery

## 2017-07-19 NOTE — Telephone Encounter (Signed)
Gave patient 12 syringe flushes from office until next appointment.

## 2017-07-19 NOTE — Telephone Encounter (Signed)
Patient needs Rx for flushes for drain. Please advise

## 2017-07-20 ENCOUNTER — Telehealth: Payer: Self-pay | Admitting: General Practice

## 2017-07-20 LAB — AEROBIC/ANAEROBIC CULTURE (SURGICAL/DEEP WOUND)

## 2017-07-20 LAB — AEROBIC/ANAEROBIC CULTURE W GRAM STAIN (SURGICAL/DEEP WOUND)

## 2017-07-20 MED ORDER — METRONIDAZOLE 500 MG PO TABS: 500.0000 mg | ORAL_TABLET | Freq: Three times a day (TID) | ORAL | 0 refills | Status: DC

## 2017-07-20 MED ORDER — CIPROFLOXACIN HCL 500 MG PO TABS: 500.0000 mg | ORAL_TABLET | Freq: Two times a day (BID) | ORAL | 0 refills | Status: DC

## 2017-07-20 NOTE — Telephone Encounter (Signed)
Patient states he has a rash on his chest, under arms, and back. You have not taken pain medication or Antibiotic today. He states his fingers were swelling  But has improved since not taking medication today. He has taken benadryl and was encouraged to take it every 4-6 hours per directions on bottle.  He stated he would take ibuprofen for his discomfort. I will talk with Dr.Piscoya regarding medications and rash and call patient later today.

## 2017-07-20 NOTE — Telephone Encounter (Signed)
Spoke with Dr.Piscoya and he prefers to switch the patient to Flagyl and Cipro and to continue with ibuprofen.  [facility] [place]  per patient request.   Patient has post op appointment tomorrow.

## 2017-07-20 NOTE — Telephone Encounter (Signed)
Patient called and left a voicemail patient said he is on some pain killer also he's been taken an antibiotic patient to believes he's having an allergic reaction to one of the medications patient complains of his fingers being swollen and is asking what should he take or do just has questions, patient is coming in tomorrow for an appointment. Please call patient and advise.

## 2017-07-21 ENCOUNTER — Ambulatory Visit (INDEPENDENT_AMBULATORY_CARE_PROVIDER_SITE_OTHER): Payer: BLUE CROSS/BLUE SHIELD | Admitting: Surgery

## 2017-07-21 ENCOUNTER — Encounter: Payer: Self-pay | Admitting: Surgery

## 2017-07-21 VITALS — BP 113/71 | HR 75 | Ht 74.0 in | Wt 227.8 lb

## 2017-07-21 DIAGNOSIS — K3532 Acute appendicitis with perforation and localized peritonitis, without abscess: Secondary | ICD-10-CM | POA: Diagnosis not present

## 2017-07-21 NOTE — Patient Instructions (Signed)
Please continue the Antibiotics. You may try Hydrocortisone cream for the rash as well.   You may continue the Benadryl.  Please see your follow up appointment listed below.

## 2017-07-21 NOTE — Progress Notes (Signed)
  07/21/2017  History of Present Illness: Renne MuscaMichael Monacelli is a 42 y.o. male who presents for follow up for perforated appendicitis with abscess requiring IR drainage on 07/16/17.  He presents for follow up.  He did develop a rash with Augmentin, and this was changed yesterday to Cipro and Flagyl.  He reports that other than the rash, he has been doing very well with no further pain, no fevers, no chills, nausea, and no vomiting.  He has good appetite and normal bowel movements.  He has been working well he has been flushing as indicated.  Very minimal output per day.  Past Medical History: Past Medical History:  Diagnosis Date  . Bicuspid aortic valve      Past Surgical History: --None  Home Medications: Prior to Admission medications   Medication Sig Start Date End Date Taking? Authorizing Provider  ciprofloxacin (CIPRO) 500 MG tablet Take 1 tablet (500 mg total) by mouth 2 (two) times daily. 07/20/17  Yes Trenity Pha, Elita QuickJose, MD  metroNIDAZOLE (FLAGYL) 500 MG tablet Take 1 tablet (500 mg total) by mouth 3 (three) times daily. 07/20/17  Yes Mazzie Brodrick, Elita QuickJose, MD    Allergies: No Known Allergies  Review of Systems: Review of Systems  Constitutional: Negative for chills and fever.  Respiratory: Negative for shortness of breath.   Cardiovascular: Negative for chest pain.  Gastrointestinal: Negative for abdominal pain, constipation, diarrhea, nausea and vomiting.  Skin: Positive for itching and rash.    Physical Exam BP 113/71   Pulse 75   Ht 6\' 2"  (1.88 m)   Wt 103.3 kg (227 lb 12.8 oz)   BMI 29.25 kg/m  CONSTITUTIONAL: No acute distress RESPIRATORY:  Lungs are clear, and breath sounds are equal bilaterally. Normal respiratory effort without pathologic use of accessory muscles. CARDIOVASCULAR: Heart is regular without murmurs, gallops, or rubs. GI: The abdomen is soft, nondistended, nontender to palpation.  Percutaneous drain in place with serosanguineous fluid in bulb, minimal output.  Drain  was removed at bedside without complications.  Dry gauze dressing applied.  SKIN: Maculopapular rash over the left axilla, low back, left flank, left upper extremity and right lower quadrant.  No evidence of any drainage. NEUROLOGIC:  Motor and sensation is grossly normal.  Cranial nerves are grossly intact. PSYCH:  Alert and oriented to person, place and time. Affect is normal.  Labs/Imaging: None recently  Assessment and Plan: This is a 42 y.o. male who presents for follow-up for ruptured appendicitis requiring percutaneous drainage for an abscess.  --Drain removed at bedside without complications given the low volume output. -Patient will continue Cipro Flagyl regimen until completed. -Patient may use hydrocortisone ointment or Benadryl for itching and the rash. -Patient will follow-up next month so that we can discuss surgical options to allow for some of this inflammation to cool down still.  We would plan for surgery towards the end of March.  Face-to-face time spent with the patient and care providers was 25 minutes, with more than 50% of the time spent counseling, educating, and coordinating care of the patient.     Howie IllJose Luis Shalena Ezzell, MD [facility]

## 2017-07-21 NOTE — Discharge Summary (Signed)
Physician Discharge Summary  Patient ID: Edward Kaufman MRN: [id number] DOB/AGE: 1975/10/06 42 y.o.  Admit date: 07/15/2017 Discharge date: 07/21/2017  Admission Diagnoses:  Discharge Diagnoses:  Active Problems:   Perforated appendicitis   Discharged Condition: good  Hospital Course: 42 y.o. male presented [facility] ED for abdominal pain. Workup was found to be significant for CT imaging demonstrating perforated appendicitis with peri-appendiceal abscess. Patient underwent image-guided abscess drainage, after which his WBC increased before normalizing and patient's pain improved/resolved. Advancement of patient's diet and ambulation were well-tolerated. The remainder of patient's hospital course was essentially unremarkable, and discharge planning was initiated accordingly with patient safely able to be discharged home with appropriate discharge instructions, antibiotics (augmentin), pain control, and outpatient surgical follow-up after all of his questions were answered to his expressed satisfaction.  Consults: IR  Significant Diagnostic Studies: labs: WBC 10.4 at admission, increased to 16.0 following image-guided peri-appendiceal abscess drainage, and subsequently normalized and radiology: CT scan: perforated appendicitis with peri-appendiceal abscess  Treatments: IV hydration, antibiotics: Zosyn and procedures: percutaneous drainage catheter placement  Discharge Exam: Blood pressure (!) 118/59, pulse 66, temperature 98.2 F (36.8 C), temperature source Oral, resp. rate 19, height 6\' 2"  (1.88 m), weight 220 lb (99.8 kg), SpO2 100 %. General appearance: alert, cooperative and no distress GI: abdomen soft and non-distended with mild/moderate tenderness to palpation at his abscess drain containing small amount of purulent fluid  Disposition: 01-Home or Self Care   Allergies as of 07/18/2017   No Known Allergies     Medication List    You have not been prescribed any medications.     Follow-up Information    [facility]. Schedule an appointment as soon as possible for a visit in 1 week(s).   Specialty:  General Surgery Contact information: [address] [place] [id number] [phone]          Signed: Ancil LinseyJason Evan Alilah Mcmeans 07/21/2017, 4:18 PM

## 2017-07-27 DIAGNOSIS — K3533 Acute appendicitis with perforation and localized peritonitis, with abscess: Secondary | ICD-10-CM

## 2017-08-06 HISTORY — PX: IR CHEST FLUORO: IMG2383

## 2017-08-07 ENCOUNTER — Telehealth: Payer: Self-pay | Admitting: Surgery

## 2017-08-07 NOTE — Telephone Encounter (Signed)
08/07/17  Patient called today because he was starting to develop some mild discomfort over the mid abdomen.  He has a history of ruptured appendicitis with abscess requiring IR drainage on 2/7.  Drain has been removed and he completed his antibiotic course.  Denies any fevers, chills, chest pain, shortness of breath, nausea, vomiting, and is having normal bowel function.  Given his mild symptoms, discussed with patient staring a course of Augmentin.  If there is no improvement over the next 48 hours, he should call the office again so he can get a set of labs and CT scan ordered to evaluate for worsening or recurrent abscess.  Otherwise if he improves, he can keep his follow up appointment with me on 3/7.  Patient understands this plan and all of his questions have been answered.   [name], MD [facility]

## 2017-08-12 ENCOUNTER — Ambulatory Visit (INDEPENDENT_AMBULATORY_CARE_PROVIDER_SITE_OTHER): Payer: BLUE CROSS/BLUE SHIELD | Admitting: Surgery

## 2017-08-12 ENCOUNTER — Encounter: Payer: Self-pay | Admitting: Surgery

## 2017-08-12 VITALS — BP 119/74 | HR 80 | Temp 97.4°F | Ht 74.0 in | Wt 220.4 lb

## 2017-08-12 DIAGNOSIS — K3532 Acute appendicitis with perforation and localized peritonitis, without abscess: Secondary | ICD-10-CM

## 2017-08-12 NOTE — Progress Notes (Signed)
  08/12/2017  History of Present Illness: Edward Kaufman is a 42 y.o. male who presents for follow up of ruptured appendicitis with abscess, requiring IR drainage.  Drain has been removed already and he was doing well but called over the weekend with mild lower abdominal discomfort, which was similar as his early symptoms of appendicitis.  We started him on a course of Augmentin and he's doing much better now without significant pain.  Denies any fevers, chills, chest pain, shortness of breath.  Past Medical History: Past Medical History:  Diagnosis Date  . Bicuspid aortic valve      Past Surgical History: None  Home Medications: Prior to Admission medications   Augmentin    Allergies: No Known Allergies  Review of Systems: Review of Systems  Constitutional: Negative for chills and fever.  Respiratory: Negative for shortness of breath.   Cardiovascular: Negative for chest pain.  Gastrointestinal: Positive for abdominal pain. Negative for constipation, diarrhea, nausea and vomiting.    Physical Exam BP 119/74   Pulse 80   Temp (!) 97.4 F (36.3 C) (Oral)   Ht 6\' 2"  (1.88 m)   Wt 100 kg (220 lb 6.4 oz)   BMI 28.30 kg/m  CONSTITUTIONAL: No acute distress RESPIRATORY:  Lungs are clear, and breath sounds are equal bilaterally. Normal respiratory effort without pathologic use of accessory muscles. CARDIOVASCULAR: Heart is regular without murmurs, gallops, or rubs. GI: The abdomen is soft, nondistended, nontender to palpation.  No peritoneal signs.  MUSCULOSKELETAL:  Normal muscle strength and tone in all four extremities.  No peripheral edema or cyanosis. SKIN: Skin turgor is normal. There are no pathologic skin lesions.  NEUROLOGIC:  Motor and sensation is grossly normal.  Cranial nerves are grossly intact. PSYCH:  Alert and oriented to person, place and time. Affect is normal.  Labs/Imaging: None recently  Assessment and Plan: This is a 42 y.o. male who presents for follow  up of ruptured appendicitis with abscess.  --discussed with patient that he should continue with Augmentin and complete the course. --Will plan for laparoscopic appendectomy on 4/2.  Discussed risks of bleeding, infection, injury to surrounding structures.  Discussed that if the base of the appendix is inflamed still, would resect sliver of cecum as well to get clean edges.  If there is any residual abscess, would wash out and leave a drain as well.  He understands this plan and all of his questions have been answered.  Face-to-face time spent with the patient and care providers was 25 minutes, with more than 50% of the time spent counseling, educating, and coordinating care of the patient.     Edward IllJose Luis Americus Perkey, MD [facility]

## 2017-08-12 NOTE — Patient Instructions (Signed)
We have scheduled you for an elective Appendectomy. Please see the following information regarding your surgery.   Please see your blue pre-care sheet for surgery information.     Typically, our patients are out of work 1-2 weeks following the surgery and may return with a lifting restriction of no more than 15 lbs. For a complete 6 weeks following surgery. If you need [place] to fill out any FMLA or disability paperwork for your employer, please submit that to our office as soon as you can. This is completed within 3 days of your scheduled surgery and requires a $25.00 one time fee that can be paid over the phone or at our front desk.  Also, please review the [facility]) Pre-Care sheet for further information regarding your surgery. If you have any questions, please do not hesitate to contact our office.  Laparoscopic Appendectomy, Adult, Care After Refer to this sheet in the next few weeks. These instructions provide you with information about caring for yourself after your procedure. Your health care provider may also give you more specific instructions. Your treatment has been planned according to current medical practices, but problems sometimes occur. Call your health care provider if you have any problems or questions after your procedure. What can I expect after the procedure? After the procedure, it is common to have:  A decrease in your energy level.  Mild pain in the area where the surgical cuts (incisions) were made.  Constipation. This can be caused by pain medicine and a decrease in your activity.  Follow these instructions at home: Medicines  Take over-the-counter and prescription medicines only as told by your health care provider.  Do not drive for 24 hours if you received a sedative.  Do not drive or operate heavy machinery while taking prescription pain medicine.  If you were prescribed an antibiotic medicine, take it as told by your health care provider. Do not stop taking the  antibiotic even if you start to feel better. Activity  For 3 weeks or as long as told by your health care provider: ? Do not lift anything that is heavier than 10 pounds (4.5 kg). ? Do not play contact sports.  Gradually return to your normal activities. Ask your health care provider what activities are safe for you. Bathing  Keep your incisions clean and dry. Clean them as often as told by your health care provider: ? Gently wash the incisions with soap and water. ? Rinse the incisions with water to remove all soap. ? Pat the incisions dry with a clean towel. Do not rub the incisions.  You may take showers after 48 hours.  Do not take baths, swim, or use hot tubs for 2 weeks or as told by your health care provider. Incision care  Follow instructions from your healthcare provider about how to take care of your incisions. Make sure you: ? Wash your hands with soap and water before you change your bandage (dressing). If soap and water are not available, use hand sanitizer. ? Change your dressing as told by your health care provider. ? Leave stitches (sutures), skin glue, or adhesive strips in place. These skin closures may need to stay in place for 2 weeks or longer. If adhesive strip edges start to loosen and curl up, you may trim the loose edges. Do not remove adhesive strips completely unless your health care provider tells you to do that.  Check your incision areas every day for signs of infection. Check for: ? More  redness, swelling, or pain. ? More fluid or blood. ? Warmth. ? Pus or a bad smell. Other Instructions  If you were sent home with a drain, follow instructions from your health care provider about how to care for the drain and how to empty it.  Take deep breaths. This helps to prevent your lungs from becoming inflamed.  To relieve and prevent constipation: ? Drink plenty of fluids. ? Eat plenty of fruits and vegetables.  Keep all follow-up visits as told by your  health care provider. This is important. Contact a health care provider if:  You have more redness, swelling, or pain around an incision.  You have more fluid or blood coming from an incision.  Your incision feels warm to the touch.  You have pus or a bad smell coming from an incision or dressing.  Your incision edges break open after your sutures have been removed.  You have increasing pain in your shoulders.  You feel dizzy or you faint.  You develop shortness of breath.  You keep feeling nauseous or vomiting.  You have diarrhea or you cannot control your bowel functions.  You lose your appetite.  You develop swelling or pain in your legs. Get help right away if:  You have a fever.  You develop a rash.  You have difficulty breathing.  You have sharp pains in your chest. This information is not intended to replace advice given to you by your health care provider. Make sure you discuss any questions you have with your health care provider. Document Released: 05/25/2005 Document Revised: 10/25/2015 Document Reviewed: 11/12/2014 Elsevier Interactive Patient Education  2017 [employer]

## 2017-08-12 NOTE — H&P (View-Only) (Signed)
  08/12/2017  History of Present Illness: Edward Kaufman is a 41 y.o. male who presents for follow up of ruptured appendicitis with abscess, requiring IR drainage.  Drain has been removed already and he was doing well but called over the weekend with mild lower abdominal discomfort, which was similar as his early symptoms of appendicitis.  We started him on a course of Augmentin and he's doing much better now without significant pain.  Denies any fevers, chills, chest pain, shortness of breath.  Past Medical History: Past Medical History:  Diagnosis Date  . Bicuspid aortic valve      Past Surgical History: None  Home Medications: Prior to Admission medications   Augmentin    Allergies: No Known Allergies  Review of Systems: Review of Systems  Constitutional: Negative for chills and fever.  Respiratory: Negative for shortness of breath.   Cardiovascular: Negative for chest pain.  Gastrointestinal: Positive for abdominal pain. Negative for constipation, diarrhea, nausea and vomiting.    Physical Exam BP 119/74   Pulse 80   Temp (!) 97.4 F (36.3 C) (Oral)   Ht 6' 2" (1.88 m)   Wt 100 kg (220 lb 6.4 oz)   BMI 28.30 kg/m  CONSTITUTIONAL: No acute distress RESPIRATORY:  Lungs are clear, and breath sounds are equal bilaterally. Normal respiratory effort without pathologic use of accessory muscles. CARDIOVASCULAR: Heart is regular without murmurs, gallops, or rubs. GI: The abdomen is soft, nondistended, nontender to palpation.  No peritoneal signs.  MUSCULOSKELETAL:  Normal muscle strength and tone in all four extremities.  No peripheral edema or cyanosis. SKIN: Skin turgor is normal. There are no pathologic skin lesions.  NEUROLOGIC:  Motor and sensation is grossly normal.  Cranial nerves are grossly intact. PSYCH:  Alert and oriented to person, place and time. Affect is normal.  Labs/Imaging: None recently  Assessment and Plan: This is a 41 y.o. male who presents for follow  up of ruptured appendicitis with abscess.  --discussed with patient that he should continue with Augmentin and complete the course. --Will plan for laparoscopic appendectomy on 4/2.  Discussed risks of bleeding, infection, injury to surrounding structures.  Discussed that if the base of the appendix is inflamed still, would resect sliver of cecum as well to get clean edges.  If there is any residual abscess, would wash out and leave a drain as well.  He understands this plan and all of his questions have been answered.  Face-to-face time spent with the patient and care providers was 25 minutes, with more than 50% of the time spent counseling, educating, and coordinating care of the patient.     Giovannie Scerbo Luis Doctor Sheahan, MD [facility]    

## 2017-08-18 DIAGNOSIS — Q231 Congenital insufficiency of aortic valve: Secondary | ICD-10-CM | POA: Insufficient documentation

## 2017-08-19 ENCOUNTER — Telehealth: Payer: Self-pay | Admitting: Surgery

## 2017-08-19 NOTE — Telephone Encounter (Signed)
Pt advised of pre op date/time and sx date. Sx: 09/07/17 with Dr Piscoya--laparoscopic appendectomy. Pre op: 08/31/17 between 9-1:00--phone interview.   Patient made aware to call [phone], between 1-3:00pm the day before surgery, to find out what time to arrive.

## 2017-08-31 ENCOUNTER — Ambulatory Visit: Admission: RE | Admit: 2017-08-31 | Payer: BLUE CROSS/BLUE SHIELD | Source: Ambulatory Visit

## 2017-09-06 ENCOUNTER — Encounter
Admission: RE | Admit: 2017-09-06 | Discharge: 2017-09-06 | Disposition: A | Discharge status: Home / Self Care | Payer: BLUE CROSS/BLUE SHIELD | Source: Ambulatory Visit | Attending: Surgery | Admitting: Surgery

## 2017-09-06 ENCOUNTER — Other Ambulatory Visit: Payer: Self-pay

## 2017-09-06 HISTORY — DX: Anemia, unspecified: D64.9

## 2017-09-06 HISTORY — DX: Unspecified appendicitis: K37

## 2017-09-06 MED ORDER — SODIUM CHLORIDE 0.9 % IV SOLN
2.0000 g | INTRAVENOUS | Status: AC
  Administered 2017-09-07: 2 g via INTRAVENOUS
  Filled 2017-09-06: qty 2

## 2017-09-06 NOTE — Patient Instructions (Signed)
Your procedure is scheduled on: 09-07-17 Report to Same Day Surgery 2nd floor medical mall [facility](Medical Mall Entrance-take elevator on left to 2nd floor.  Check in with surgery information desk.) To find out your arrival time please call [phone] between 1PM - 3PM on 09-06-17  Remember: Instructions that are not followed completely may result in serious medical risk, up to and including death, or upon the discretion of your surgeon and anesthesiologist your surgery may need to be rescheduled.    _x___ 1. Do not eat food after midnight the night before your procedure. NO GUM OR CANDY AFTER MIDNIGHT.  You may drink clear liquids up to 2 hours before you are scheduled to arrive at the hospital for your procedure.  Do not drink clear liquids within 2 hours of your scheduled arrival to the hospital.  Clear liquids include  --Water or Apple juice without pulp  --Clear carbohydrate beverage such as ClearFast or Gatorade  --Black Coffee or Clear Tea (No milk, no creamers, do not add anything to  the coffee or Tea    __x__ 2. No Alcohol for 24 hours before or after surgery.   __x__3. No Smoking or e-cigarettes for 24 prior to surgery.  Do not use any chewable tobacco products for at least 6 hour prior to surgery   ____  4. Bring all medications with you on the day of surgery if instructed.    __x__ 5. Notify your doctor if there is any change in your medical condition     (cold, fever, infections).    x___6. On the morning of surgery brush your teeth with toothpaste and water.  You may rinse your mouth with mouth wash if you wish.  Do not swallow any toothpaste or mouthwash.   Do not wear jewelry, make-up, hairpins, clips or nail polish.  Do not wear lotions, powders, or perfumes. You may wear deodorant.  Do not shave 48 hours prior to surgery. Men may shave face and neck.  Do not bring valuables to the hospital.    [facility] is not responsible for any belongings or valuables.    Contacts, dentures or bridgework may not be worn into surgery.  Leave your suitcase in the car. After surgery it may be brought to your room.  For patients admitted to the hospital, discharge time is determined by your  treatment team.  _  Patients discharged the day of surgery will not be allowed to drive home.  You will need someone to drive you home and stay with you the night of your procedure.   ____ Take anti-hypertensive listed below, cardiac, seizure, asthma, anti-reflux and psychiatric medicines. These include:  1. NONE  2.  3.  4.  5.  6.  ____Fleets enema or Magnesium Citrate as directed.   ____ Use CHG Soap or sage wipes as directed on instruction sheet   ____ Use inhalers on the day of surgery and bring to hospital day of surgery  ____ Stop Metformin and Janumet 2 days prior to surgery.    ____ Take 1/2 of usual insulin dose the night before surgery and none on the morning surgery.   ____ Follow recommendations from Cardiologist, Pulmonologist or PCP regarding stopping Aspirin, Coumadin, Plavix ,Eliquis, Effient, or Pradaxa, and Pletal.  X____Stop Anti-inflammatories such as Advil, Aleve, Ibuprofen, Motrin, Naproxen, Naprosyn, Goodies powders or aspirin products NOW-OK to take Tylenol   ____ Stop supplements until after surgery.     ____ Bring C-Pap to the hospital.

## 2017-09-07 ENCOUNTER — Encounter
Admission: RE | Disposition: A | Discharge status: Home / Self Care | Payer: Self-pay | Source: Ambulatory Visit | Attending: Surgery

## 2017-09-07 ENCOUNTER — Ambulatory Visit: Payer: BLUE CROSS/BLUE SHIELD | Admitting: Anesthesiology

## 2017-09-07 ENCOUNTER — Ambulatory Visit
Admission: RE | Admit: 2017-09-07 | Discharge: 2017-09-07 | Disposition: A | Discharge status: Home / Self Care | Payer: BLUE CROSS/BLUE SHIELD | Source: Ambulatory Visit | Attending: Surgery | Admitting: Surgery

## 2017-09-07 ENCOUNTER — Encounter: Payer: Self-pay | Admitting: *Deleted

## 2017-09-07 DIAGNOSIS — K3533 Acute appendicitis with perforation and localized peritonitis, with abscess: Secondary | ICD-10-CM | POA: Insufficient documentation

## 2017-09-07 DIAGNOSIS — K3532 Acute appendicitis with perforation and localized peritonitis, without abscess: Secondary | ICD-10-CM | POA: Diagnosis not present

## 2017-09-07 DIAGNOSIS — K429 Umbilical hernia without obstruction or gangrene: Secondary | ICD-10-CM | POA: Diagnosis not present

## 2017-09-07 HISTORY — PX: LAPAROSCOPIC APPENDECTOMY: SHX408

## 2017-09-07 SURGERY — APPENDECTOMY, LAPAROSCOPIC
Anesthesia: General | Site: Abdomen | Wound class: Contaminated

## 2017-09-07 MED ORDER — ACETAMINOPHEN 500 MG PO TABS
1000.0000 mg | ORAL_TABLET | ORAL | Status: AC
  Administered 2017-09-07: 1000 mg via ORAL

## 2017-09-07 MED ORDER — FENTANYL CITRATE (PF) 100 MCG/2ML IJ SOLN
INTRAMUSCULAR | Status: DC | PRN
  Administered 2017-09-07: 100 ug via INTRAVENOUS

## 2017-09-07 MED ORDER — SEVOFLURANE IN SOLN
RESPIRATORY_TRACT | Status: AC
  Filled 2017-09-07: qty 250

## 2017-09-07 MED ORDER — ROCURONIUM BROMIDE 100 MG/10ML IV SOLN
INTRAVENOUS | Status: DC | PRN
  Administered 2017-09-07: 50 mg via INTRAVENOUS
  Administered 2017-09-07: 20 mg via INTRAVENOUS

## 2017-09-07 MED ORDER — PROMETHAZINE HCL 25 MG/ML IJ SOLN: 6.2500 mg | INTRAMUSCULAR | Status: DC | PRN

## 2017-09-07 MED ORDER — BUPIVACAINE-EPINEPHRINE (PF) 0.5% -1:200000 IJ SOLN
INTRAMUSCULAR | Status: DC | PRN
  Administered 2017-09-07: 30 mL

## 2017-09-07 MED ORDER — IBUPROFEN 600 MG PO TABS: 600.0000 mg | ORAL_TABLET | Freq: Three times a day (TID) | ORAL | 0 refills | Status: DC | PRN

## 2017-09-07 MED ORDER — GABAPENTIN 300 MG PO CAPS
ORAL_CAPSULE | ORAL | Status: AC
  Filled 2017-09-07: qty 1

## 2017-09-07 MED ORDER — SUGAMMADEX SODIUM 500 MG/5ML IV SOLN
INTRAVENOUS | Status: DC | PRN
  Administered 2017-09-07: 200 mg via INTRAVENOUS

## 2017-09-07 MED ORDER — LACTATED RINGERS IV SOLN
INTRAVENOUS | Status: DC
  Administered 2017-09-07 (×2): via INTRAVENOUS

## 2017-09-07 MED ORDER — KETOROLAC TROMETHAMINE 30 MG/ML IJ SOLN: INTRAMUSCULAR | Status: DC | PRN

## 2017-09-07 MED ORDER — FENTANYL CITRATE (PF) 100 MCG/2ML IJ SOLN
INTRAMUSCULAR | Status: AC
  Filled 2017-09-07: qty 2

## 2017-09-07 MED ORDER — EPHEDRINE SULFATE 50 MG/ML IJ SOLN
INTRAMUSCULAR | Status: AC
  Filled 2017-09-07: qty 1

## 2017-09-07 MED ORDER — HYDROCODONE-ACETAMINOPHEN 7.5-325 MG PO TABS
1.0000 | ORAL_TABLET | Freq: Once | ORAL | Status: AC | PRN
  Administered 2017-09-07: 1 via ORAL

## 2017-09-07 MED ORDER — GABAPENTIN 300 MG PO CAPS
300.0000 mg | ORAL_CAPSULE | ORAL | Status: AC
  Administered 2017-09-07: 300 mg via ORAL

## 2017-09-07 MED ORDER — KETOROLAC TROMETHAMINE 30 MG/ML IJ SOLN
INTRAMUSCULAR | Status: DC | PRN
  Administered 2017-09-07: 30 mg via INTRAVENOUS

## 2017-09-07 MED ORDER — FAMOTIDINE 20 MG PO TABS
20.0000 mg | ORAL_TABLET | Freq: Once | ORAL | Status: AC
  Administered 2017-09-07: 20 mg via ORAL

## 2017-09-07 MED ORDER — ROCURONIUM BROMIDE 50 MG/5ML IV SOLN
INTRAVENOUS | Status: AC
  Filled 2017-09-07: qty 1

## 2017-09-07 MED ORDER — LIDOCAINE HCL (CARDIAC) 20 MG/ML IV SOLN
INTRAVENOUS | Status: DC | PRN
  Administered 2017-09-07: 40 mg via INTRAVENOUS

## 2017-09-07 MED ORDER — MIDAZOLAM HCL 2 MG/2ML IJ SOLN
INTRAMUSCULAR | Status: DC | PRN
  Administered 2017-09-07: 2 mg via INTRAVENOUS

## 2017-09-07 MED ORDER — HYDROCODONE-ACETAMINOPHEN 7.5-325 MG PO TABS
ORAL_TABLET | ORAL | Status: AC
  Filled 2017-09-07: qty 1

## 2017-09-07 MED ORDER — ACETAMINOPHEN 160 MG/5ML PO SOLN
325.0000 mg | ORAL | Status: DC | PRN
  Filled 2017-09-07: qty 20.3

## 2017-09-07 MED ORDER — SUCCINYLCHOLINE CHLORIDE 20 MG/ML IJ SOLN
INTRAMUSCULAR | Status: AC
  Filled 2017-09-07: qty 1

## 2017-09-07 MED ORDER — MEPERIDINE HCL 50 MG/ML IJ SOLN: 6.2500 mg | INTRAMUSCULAR | Status: DC | PRN

## 2017-09-07 MED ORDER — OXYCODONE HCL 5 MG PO TABS: 5.0000 mg | ORAL_TABLET | Freq: Four times a day (QID) | ORAL | 0 refills | Status: DC | PRN

## 2017-09-07 MED ORDER — FAMOTIDINE 20 MG PO TABS
ORAL_TABLET | ORAL | Status: AC
  Filled 2017-09-07: qty 1

## 2017-09-07 MED ORDER — HYDROMORPHONE HCL 1 MG/ML IJ SOLN: 0.2500 mg | INTRAMUSCULAR | Status: DC | PRN

## 2017-09-07 MED ORDER — PROPOFOL 10 MG/ML IV BOLUS
INTRAVENOUS | Status: AC
  Filled 2017-09-07: qty 20

## 2017-09-07 MED ORDER — CHLORHEXIDINE GLUCONATE CLOTH 2 % EX PADS
6.0000 | MEDICATED_PAD | Freq: Once | CUTANEOUS | Status: AC
  Administered 2017-09-07: 6 via TOPICAL

## 2017-09-07 MED ORDER — ACETAMINOPHEN 500 MG PO TABS
ORAL_TABLET | ORAL | Status: AC
  Filled 2017-09-07: qty 2

## 2017-09-07 MED ORDER — PROPOFOL 10 MG/ML IV BOLUS
INTRAVENOUS | Status: DC | PRN
  Administered 2017-09-07: 40 mg via INTRAVENOUS
  Administered 2017-09-07: 150 mg via INTRAVENOUS

## 2017-09-07 MED ORDER — LIDOCAINE HCL (PF) 2 % IJ SOLN
INTRAMUSCULAR | Status: AC
  Filled 2017-09-07: qty 10

## 2017-09-07 MED ORDER — ACETAMINOPHEN 325 MG PO TABS: 325.0000 mg | ORAL_TABLET | ORAL | Status: DC | PRN

## 2017-09-07 MED ORDER — CHLORHEXIDINE GLUCONATE CLOTH 2 % EX PADS: 6.0000 | MEDICATED_PAD | Freq: Once | CUTANEOUS | Status: DC

## 2017-09-07 MED ORDER — MIDAZOLAM HCL 2 MG/2ML IJ SOLN
INTRAMUSCULAR | Status: AC
  Filled 2017-09-07: qty 2

## 2017-09-07 MED ORDER — EPHEDRINE SULFATE 50 MG/ML IJ SOLN
INTRAMUSCULAR | Status: DC | PRN
  Administered 2017-09-07: 10 mg via INTRAVENOUS

## 2017-09-07 MED ORDER — HYDROMORPHONE HCL 1 MG/ML IJ SOLN
INTRAMUSCULAR | Status: AC
  Filled 2017-09-07: qty 1

## 2017-09-07 SURGICAL SUPPLY — 37 items
CANISTER SUCT 1200ML W/VALVE (MISCELLANEOUS) ×3 IMPLANT
CHLORAPREP W/TINT 26ML (MISCELLANEOUS) ×3 IMPLANT
CUTTER FLEX LINEAR 45M (STAPLE) IMPLANT
DERMABOND ADVANCED (GAUZE/BANDAGES/DRESSINGS) ×2
DERMABOND ADVANCED .7 DNX12 (GAUZE/BANDAGES/DRESSINGS) ×1 IMPLANT
ELECT CAUTERY BLADE 6.4 (BLADE) ×3 IMPLANT
ELECT REM PT RETURN 9FT ADLT (ELECTROSURGICAL) ×3
ELECTRODE REM PT RTRN 9FT ADLT (ELECTROSURGICAL) ×1 IMPLANT
GLOVE SURG SYN 7.0 (GLOVE) ×3 IMPLANT
GLOVE SURG SYN 7.5  E (GLOVE) ×2
GLOVE SURG SYN 7.5 E (GLOVE) ×1 IMPLANT
GOWN STRL REUS W/ TWL LRG LVL3 (GOWN DISPOSABLE) ×2 IMPLANT
GOWN STRL REUS W/TWL LRG LVL3 (GOWN DISPOSABLE) ×4
IRRIGATION STRYKERFLOW (MISCELLANEOUS) ×1 IMPLANT
IRRIGATOR STRYKERFLOW (MISCELLANEOUS) ×3
IV NS 1000ML (IV SOLUTION) ×2
IV NS 1000ML BAXH (IV SOLUTION) ×1 IMPLANT
KIT TURNOVER KIT A (KITS) ×3 IMPLANT
LABEL OR SOLS (LABEL) IMPLANT
LIGASURE LAP MARYLAND 5MM 37CM (ELECTROSURGICAL) ×3 IMPLANT
NEEDLE HYPO 22GX1.5 SAFETY (NEEDLE) ×3 IMPLANT
NS IRRIG 500ML POUR BTL (IV SOLUTION) ×3 IMPLANT
PACK LAP CHOLECYSTECTOMY (MISCELLANEOUS) ×3 IMPLANT
PENCIL ELECTRO HAND CTR (MISCELLANEOUS) ×3 IMPLANT
POUCH SPECIMEN RETRIEVAL 10MM (ENDOMECHANICALS) ×3 IMPLANT
RELOAD 45 VASCULAR/THIN (ENDOMECHANICALS) IMPLANT
RELOAD STAPLE TA45 3.5 REG BLU (ENDOMECHANICALS) ×3 IMPLANT
SCISSORS METZENBAUM CVD 33 (INSTRUMENTS) ×3 IMPLANT
SLEEVE ADV FIXATION 5X100MM (TROCAR) ×6 IMPLANT
SUT MNCRL 4-0 (SUTURE) ×2
SUT MNCRL 4-0 27XMFL (SUTURE) ×1
SUT VICRYL 0 AB UR-6 (SUTURE) ×3 IMPLANT
SUTURE MNCRL 4-0 27XMF (SUTURE) ×1 IMPLANT
TRAY FOLEY W/METER SILVER 16FR (SET/KITS/TRAYS/PACK) ×3 IMPLANT
TROCAR BALLN GELPORT 12X130M (ENDOMECHANICALS) ×3 IMPLANT
TROCAR Z-THREAD OPTICAL 5X100M (TROCAR) ×3 IMPLANT
TUBING INSUFFLATION (TUBING) ×3 IMPLANT

## 2017-09-07 NOTE — Anesthesia Procedure Notes (Signed)
Procedure Name: Intubation Date/Time: 09/07/2017 9:47 AM Performed by: Allean Found, CRNA Pre-anesthesia Checklist: Patient identified, Emergency Drugs available, Suction available, Patient being monitored and Timeout performed Patient Re-evaluated:Patient Re-evaluated prior to induction Oxygen Delivery Method: Circle system utilized Preoxygenation: Pre-oxygenation with 100% oxygen Induction Type: IV induction Ventilation: Mask ventilation without difficulty Laryngoscope Size: Mac and 3 Grade View: Grade II Tube type: Oral Tube size: 7.5 mm Number of attempts: 1 Airway Equipment and Method: Stylet Placement Confirmation: ETT inserted through vocal cords under direct vision,  positive ETCO2 and breath sounds checked- equal and bilateral Secured at: 24 cm Tube secured with: Tape Dental Injury: Teeth and Oropharynx as per pre-operative assessment

## 2017-09-07 NOTE — Interval H&P Note (Signed)
History and Physical Interval Note:  09/07/2017 8:41 AM  Edward MuscaMichael Kaufman  has presented today for surgery, with the diagnosis of ruptured appendicitis  The various methods of treatment have been discussed with the patient and family. After consideration of risks, benefits and other options for treatment, the patient has consented to  Procedure(s): APPENDECTOMY LAPAROSCOPIC (N/A) as a surgical intervention .  The patient's history has been reviewed, patient examined, no change in status, stable for surgery.  I have reviewed the patient's chart and labs.  Questions were answered to the patient's satisfaction.     [name]

## 2017-09-07 NOTE — Anesthesia Post-op Follow-up Note (Signed)
Anesthesia QCDR form completed.        

## 2017-09-07 NOTE — Anesthesia Preprocedure Evaluation (Signed)
Anesthesia Evaluation  Patient identified by MRN, date of birth, ID band Patient awake    Reviewed: Allergy & Precautions, H&P , NPO status , reviewed documented beta blocker date and time   Airway Mallampati: II  TM Distance: >3 FB     Dental no notable dental hx. (+) Teeth Intact   Pulmonary neg pulmonary ROS,    Pulmonary exam normal        Cardiovascular negative cardio ROS Normal cardiovascular exam     Neuro/Psych negative neurological ROS  negative psych ROS   GI/Hepatic negative GI ROS, Neg liver ROS,   Endo/Other  negative endocrine ROS  Renal/GU negative Renal ROS  negative genitourinary   Musculoskeletal   Abdominal   Peds  Hematology negative hematology ROS (+) anemia ,   Anesthesia Other Findings S/P drain placement for abdominal abcess  Reproductive/Obstetrics                             Anesthesia Physical Anesthesia Plan  ASA: I  Anesthesia Plan: General ETT   Post-op Pain Management:    Induction:   PONV Risk Score and Plan: 2 and Ondansetron, Midazolam and Dexamethasone  Airway Management Planned:   Additional Equipment:   Intra-op Plan:   Post-operative Plan:   Informed Consent: I have reviewed the patients History and Physical, chart, labs and discussed the procedure including the risks, benefits and alternatives for the proposed anesthesia with the patient or authorized representative who has indicated his/her understanding and acceptance.   Dental Advisory Given  Plan Discussed with: CRNA  Anesthesia Plan Comments:         Anesthesia Quick Evaluation

## 2017-09-07 NOTE — Anesthesia Postprocedure Evaluation (Signed)
Anesthesia Post Note  Patient: Edward Kaufman  Procedure(s) Performed: APPENDECTOMY LAPAROSCOPIC (N/A Abdomen)  Patient location during evaluation: PACU Anesthesia Type: General Level of consciousness: awake and alert Pain management: pain level controlled Vital Signs Assessment: post-procedure vital signs reviewed and stable Respiratory status: spontaneous breathing, nonlabored ventilation and respiratory function stable Cardiovascular status: blood pressure returned to baseline and stable Postop Assessment: no apparent nausea or vomiting Anesthetic complications: no     Last Vitals:  Vitals:   09/07/17 1233 09/07/17 1303  BP: 108/77 109/79  Pulse: (!) 58 (!) 58  Resp: 12 12  Temp:    SpO2: 100% 100%    Last Pain:  Vitals:   09/07/17 1303  TempSrc:   PainSc: 0-No pain                 Christia ReadingScott T Analiyah Lechuga

## 2017-09-07 NOTE — Discharge Instructions (Signed)
Laparoscopic Appendectomy, Adult, Care After °These instructions give you information about caring for yourself after your procedure. Your doctor may also give you more specific instructions. Call your doctor if you have any problems or questions after your procedure. °Follow these instructions at home: °Medicines °· Take over-the-counter and prescription medicines only as told by your doctor. °· Do not drive for 24 hours if you received a sedative. °· Do not drive or use heavy machinery while taking prescription pain medicine. °· If you were prescribed an antibiotic medicine, take it as told by your doctor. Do not stop taking it even if you start to feel better. °Activity °· Do not lift anything that is heavier than 10 pounds (4.5 kg) for 3 weeks or as told by your doctor. °· Do not play contact sports for 3 weeks or as told by your doctor. °· Slowly return to your normal activities. °Bathing °· Keep your cuts from surgery (incisions) clean and dry. °? Gently wash the cuts with soap and water. °? Rinse the cuts with water until the soap is gone. °? Pat the cuts dry with a clean towel. Do not rub the cuts. °· You may take showers after 48 hours. °· Do not take baths, swim, or use a hot tub for 2 weeks or as told by your doctor. °Cut Care °· Follow instructions from your doctor about how to take care of your cuts. Make sure you: °? Wash your hands with soap and water before you change your bandage (dressing). If you do not have soap and water, use hand sanitizer. °? Change your bandage as told by your doctor. °? Leave stitches (sutures), skin glue, or skin tape (adhesive) strips in place. They may need to stay in place for 2 weeks or longer. If tape strips get loose and curl up, you may trim the loose edges. Do not remove tape strips completely unless your doctor says it is okay. °· Check your cuts every day for signs of infection. Check for: °? More redness, swelling, or pain. °? More fluid or  blood. °? Warmth. °? Pus or a bad smell. °Other Instructions °· If you were sent home with a drain, follow instructions from your doctor about how to use it and care for it. °· Take deep breaths. This helps to keep your lungs from getting swollen (inflamed). °· To help with constipation: °? Drink plenty of fluids. °? Eat plenty of fruits and vegetables. °· Keep all follow-up visits as told by your doctor. This is important. °Contact a doctor if: °· You have more redness, swelling, or pain around a cut from surgery. °· You have more fluid or blood coming from a cut. °· Your cut feels warm to the touch. °· You have pus or a bad smell coming from a cut or a bandage. °· The edges of a cut break open after the stitches have been taken out. °· You have pain in your shoulders that gets worse. °· You feel dizzy or you pass out (faint). °· You have shortness of breath. °· You keep feeling sick to your stomach (nauseous). °· You keep throwing up (vomiting). °· You get diarrhea or you cannot control your poop. °· You lose your appetite. °· You have swelling or pain in your legs. °Get help right away if: °· You have a fever. °· You get a rash. °· You have trouble breathing. °· You have sharp pains in your chest. °This information is not intended to replace advice given   to you by your health care provider. Make sure you discuss any questions you have with your health care provider. Document Released: 03/21/2009 Document Revised: 10/31/2015 Document Reviewed: 11/12/2014 Elsevier Interactive Patient Education  2018 [employer]  General Anesthesia, Adult, Care After These instructions provide you with information about caring for yourself after your procedure. Your health care provider may also give you more specific instructions. Your treatment has been planned according to current medical practices, but problems sometimes occur. Call your health care provider if you have any problems or questions after your  procedure. What can I expect after the procedure? After the procedure, it is common to have:  Vomiting.  A sore throat.  Mental slowness.  It is common to feel:  Nauseous.  Cold or shivery.  Sleepy.  Tired.  Sore or achy, even in parts of your body where you did not have surgery.  Follow these instructions at home: For at least 24 hours after the procedure:  Do not: ? Participate in activities where you could fall or become injured. ? Drive. ? Use heavy machinery. ? Drink alcohol. ? Take sleeping pills or medicines that cause drowsiness. ? Make important decisions or sign legal documents. ? Take care of children on your own.  Rest. Eating and drinking  If you vomit, drink water, juice, or soup when you can drink without vomiting.  Drink enough fluid to keep your urine clear or pale yellow.  Make sure you have little or no nausea before eating solid foods.  Follow the diet recommended by your health care provider. General instructions  Have a responsible adult stay with you until you are awake and alert.  Return to your normal activities as told by your health care provider. Ask your health care provider what activities are safe for you.  Take over-the-counter and prescription medicines only as told by your health care provider.  If you smoke, do not smoke without supervision.  Keep all follow-up visits as told by your health care provider. This is important. Contact a health care provider if:  You continue to have nausea or vomiting at home, and medicines are not helpful.  You cannot drink fluids or start eating again.  You cannot urinate after 8-12 hours.  You develop a skin rash.  You have fever.  You have increasing redness at the site of your procedure. Get help right away if:  You have difficulty breathing.  You have chest pain.  You have unexpected bleeding.  You feel that you are having a life-threatening or urgent problem. This  information is not intended to replace advice given to you by your health care provider. Make sure you discuss any questions you have with your health care provider. Document Released: 08/31/2000 Document Revised: 10/28/2015 Document Reviewed: 05/09/2015 Elsevier Interactive Patient Education  [employer]2018 [employer] Inc.

## 2017-09-07 NOTE — Op Note (Signed)
  Procedure Date:  09/07/2017  Pre-operative Diagnosis:  Perforated appendicitis with abscess  Post-operative Diagnosis:  Perforated appendicitis with abscess, umbilical hernia  Procedure:  Laparoscopic appendectomy, umbilical hernia repair  Surgeon:  Howie IllJose Luis Maire Govan, MD  Anesthesia:  General endotracheal  Estimated Blood Loss:  5 ml  Specimens:  appendix  Complications:  None  Indications for Procedure:  This is a 42 y.o. male with a history of ruptured appendicitis with abscess, requiring percutaneous drainage two months ago.  He presents now for interval appendectomy.  The options of surgery versus observation were reviewed with the patient and/or family. The risks of bleeding, infection, recurrence of symptoms, negative laparoscopy, potential for an open procedure, bowel injury, abscess or infection, were all discussed with the patient and he was willing to proceed.  Description of Procedure: The patient was correctly identified in the preoperative area and brought into the operating room.  The patient was placed supine with VTE prophylaxis in place.  Appropriate time-outs were performed.  Anesthesia was induced and the patient was intubated.  Foley catheter was placed.  Appropriate antibiotics were infused.  The abdomen was prepped and draped in a sterile fashion. An infraumbilical incision was made at the point of his umbilical hernia defect. Cautery was used to dissect down the umbilical stalk and the hernia defect was identified.  It measured 10 mm in size.  It was extended shortly with cautery and a Hasson trocar was inserted.  Pneumoperitoneum was obtained with appropriate opening pressures.  Two 5-mm ports were placed in the suprapubic and left lateral positions under direct visualization.  The right lower quadrant was inspected and the appendix was identified.  The distal component was still somewhat inflammed, but the base was clean. The appendix was carefully dissected. The  base of the appendix was dissected out and divided with a standard load Endo GIA. The mesoappendix was divided using the LigaSure.  The appendix was placed in an Endocatch bag and brought out through the umbilical incision.  The right lower quadrant was then inspected again revealing an intact staple line, no bleeding, and no bowel injury.  The area was thoroughly irrigated.  The 5 mm ports were removed under direct visualization and the Hasson trocar was removed.  The fascial opening was closed using 0 vicryl suture.  Local anesthetic was infused in all incisions and the incisions were closed with 4-0 Monocryl.  The wounds were cleaned and sealed with DermaBond.  Foley catheter was removed and the patient was emerged from anesthesia and extubated and brought to the recovery room for further management.  The patient tolerated the procedure well and all counts were correct at the end of the case.   Howie IllJose Luis Carrieann Spielberg, MD

## 2017-09-07 NOTE — Transfer of Care (Signed)
Immediate Anesthesia Transfer of Care Note  Patient: Edward Kaufman  Procedure(s) Performed: APPENDECTOMY LAPAROSCOPIC (N/A Abdomen)  Patient Location: PACU    Anesthesia Type:General  Level of Consciousness: awake  Airway & Oxygen Therapy: Patient Spontanous Breathing and Patient connected to face mask oxygen  Post-op Assessment: Report given to RN and Post -op Vital signs reviewed and stable  Post vital signs: Reviewed and stable  Last Vitals:  Vitals Value Taken Time  BP 121/72 09/07/2017 11:26 AM  Temp 36.3 C 09/07/2017 11:26 AM  Pulse 73 09/07/2017 11:27 AM  Resp 12 09/07/2017 11:27 AM  SpO2 100 % 09/07/2017 11:27 AM  Vitals shown include unvalidated device data.  Last Pain:  Vitals:   09/07/17 0832  TempSrc: Temporal  PainSc: 1          Complications: No apparent anesthesia complications

## 2017-09-13 LAB — SURGICAL PATHOLOGY

## 2017-09-30 ENCOUNTER — Encounter: Payer: Self-pay | Admitting: Surgery

## 2017-09-30 ENCOUNTER — Ambulatory Visit (INDEPENDENT_AMBULATORY_CARE_PROVIDER_SITE_OTHER): Payer: BLUE CROSS/BLUE SHIELD | Admitting: Surgery

## 2017-09-30 VITALS — BP 130/81 | HR 71 | Temp 98.3°F | Ht 74.0 in | Wt 224.6 lb

## 2017-09-30 DIAGNOSIS — Z09 Encounter for follow-up examination after completed treatment for conditions other than malignant neoplasm: Secondary | ICD-10-CM

## 2017-09-30 NOTE — Patient Instructions (Signed)
GENERAL POST-OPERATIVE PATIENT INSTRUCTIONS   WOUND CARE INSTRUCTIONS:  Keep a dry clean dressing on the wound if there is drainage. The initial bandage may be removed after 24 hours.  Once the wound has quit draining you may leave it open to air.  If clothing rubs against the wound or causes irritation and the wound is not draining you may cover it with a dry dressing during the daytime.  Try to keep the wound dry and avoid ointments on the wound unless directed to do so.  If the wound becomes bright red and painful or starts to drain infected material that is not clear, please contact your physician immediately.  If the wound is mildly pink and has a thick firm ridge underneath it, this is normal, and is referred to as a healing ridge.  This will resolve over the next 4-6 weeks.  BATHING: You may shower if you have been informed of this by your surgeon. However, Please do not submerge in a tub, hot tub, or pool until incisions are completely sealed or have been told by your surgeon that you may do so.  DIET:  You may eat any foods that you can tolerate.  It is a good idea to eat a high fiber diet and take in plenty of fluids to prevent constipation.  If you do become constipated you may want to take a mild laxative or take ducolax tablets on a daily basis until your bowel habits are regular.  Constipation can be very uncomfortable, along with straining, after recent surgery.  ACTIVITY:  You are encouraged to cough and deep breath or use your incentive spirometer if you were given one, every 15-30 minutes when awake.  This will help prevent respiratory complications and low grade fevers post-operatively if you had a general anesthetic.  You may want to hug a pillow when coughing and sneezing to add additional support to the surgical area, if you had abdominal or chest surgery, which will decrease pain during these times.  You are encouraged to walk and engage in light activity for the next two weeks.  You  should not lift more than 20 pounds, until 10/06/2017 as it could put you at increased risk for complications.  Twenty pounds is roughly equivalent to a plastic bag of groceries. At that time- Listen to your body when lifting, if you have pain when lifting, stop and then try again in a few days. Soreness after doing exercises or activities of daily living is normal as you get back in to your normal routine.  MEDICATIONS:  Try to take narcotic medications and anti-inflammatory medications, such as tylenol, ibuprofen, naprosyn, etc., with food.  This will minimize stomach upset from the medication.  Should you develop nausea and vomiting from the pain medication, or develop a rash, please discontinue the medication and contact your physician.  You should not drive, make important decisions, or operate machinery when taking narcotic pain medication.  SUNBLOCK Use sun block to incision area over the next year if this area will be exposed to sun. This helps decrease scarring and will allow you avoid a permanent darkened area over your incision.  QUESTIONS:  Please feel free to call our office if you have any questions, and we will be glad to assist you. [phone]    

## 2017-09-30 NOTE — Progress Notes (Signed)
09/30/2017  HPI: Patient is s/p laparoscopic appendectomy on 09/07/17.  He presents today for follow up.  He's been doing very well.  Denies any worsening pain, nausea, vomiting.  Tolerating a regular diet without issues.  Vital signs: BP 130/81   Pulse 71   Temp 98.3 F (36.8 C) (Oral)   Ht 6\' 2"  (1.88 m)   Wt 224 lb 9.6 oz (101.9 kg)   BMI 28.84 kg/m    Physical Exam: Constitutional: No acute distress Abdomen:  Soft, nondistended, nontender to palpation.  Incisions are clean, dry, intact.  Umbilical incision looks like it had opened about 1 mm width but has already sealed over.    Assessment/Plan: [age] yo male s/p lap appy  --reviewed pathology with patient.  Acute appendicitis with evidence of prior perforation with periappendiceal abscess. --reminded patient that he still has until 5/1 to complete 4 weeks of no heavy lifting or pushing of no more than 10-15 lbs.  He may resume normal activities afterwards and gradually increase intensity. --May follow up prn.   Edward IllJose Luis Alexandrina Fiorini, MD [facility]

## 2018-04-12 ENCOUNTER — Other Ambulatory Visit: Payer: Self-pay | Admitting: Physician Assistant

## 2018-04-12 ENCOUNTER — Encounter: Payer: Self-pay | Admitting: Urology

## 2018-04-12 ENCOUNTER — Other Ambulatory Visit: Payer: Self-pay

## 2018-04-12 ENCOUNTER — Ambulatory Visit (INDEPENDENT_AMBULATORY_CARE_PROVIDER_SITE_OTHER): Payer: BLUE CROSS/BLUE SHIELD | Admitting: Urology

## 2018-04-12 ENCOUNTER — Ambulatory Visit
Admission: RE | Admit: 2018-04-12 | Discharge: 2018-04-12 | Disposition: A | Discharge status: Home / Self Care | Payer: BLUE CROSS/BLUE SHIELD | Source: Ambulatory Visit | Attending: Physician Assistant | Admitting: Physician Assistant

## 2018-04-12 VITALS — BP 121/85 | HR 62 | Ht 74.0 in | Wt 226.9 lb

## 2018-04-12 DIAGNOSIS — Z3009 Encounter for other general counseling and advice on contraception: Secondary | ICD-10-CM | POA: Diagnosis not present

## 2018-04-12 DIAGNOSIS — Z9889 Other specified postprocedural states: Secondary | ICD-10-CM | POA: Diagnosis not present

## 2018-04-12 DIAGNOSIS — R1031 Right lower quadrant pain: Secondary | ICD-10-CM

## 2018-04-12 DIAGNOSIS — K769 Liver disease, unspecified: Secondary | ICD-10-CM | POA: Diagnosis not present

## 2018-04-12 DIAGNOSIS — Z9049 Acquired absence of other specified parts of digestive tract: Secondary | ICD-10-CM | POA: Diagnosis not present

## 2018-04-12 MED ORDER — IOHEXOL 300 MG/ML  SOLN
100.0000 mL | Freq: Once | INTRAMUSCULAR | Status: AC | PRN
  Administered 2018-04-12: 100 mL via INTRAVENOUS

## 2018-04-12 NOTE — Progress Notes (Signed)
04/12/2018 4:32 PM   Casimiro Needle Roycroft Apr 13, 1976 [id number]  CC: Desire for vasectomy  HPI: I had the pleasure of meeting Mr. Rabbani in urology clinic today to discuss vasectomy.  He is a 42 year old healthy and active male who desires permanent sterilization with vasectomy.  He is married with 2 teenage children.  Both he and his wife do not desire any further pregnancies.  He denies any history of other urologic problems or history of prostate cancer.  His past medical history is notable for a ruptured appendix in the spring 2019 requiring IR drainage and ultimately laparoscopic appendectomy.  He is currently experiencing right-sided flank and groin pain and is going to CT next for a scan by his primary care physician.  PMH: Past Medical History:  Diagnosis Date  . Anemia   . Appendicitis   . Bicuspid aortic valve    ASYMPTOMATIC-DOES NOT SEE A CARDIOLOGIST    Surgical History: Past Surgical History:  Procedure Laterality Date  . IR CHEST FLUORO  08/2017   DUE TO RUPTURED APPENDIX  . LAPAROSCOPIC APPENDECTOMY N/A 09/07/2017   Procedure: APPENDECTOMY LAPAROSCOPIC;  Surgeon: Henrene Dodge, MD;  Location: [facility] ORS;  Service: General;  Laterality: N/A;    Allergies: No Known Allergies  Family History: Family History  Problem Relation Age of Onset  . Prostate cancer Neg Hx   . Bladder Cancer Neg Hx   . Kidney cancer Neg Hx     Social History:  reports that he has never smoked. He has never used smokeless tobacco. He reports that he does not drink alcohol or use drugs.  ROS: Please see flowsheet from today's date for complete review of systems.  Physical Exam: BP 121/85   Pulse 62   Ht 6\' 2"  (1.88 m)   Wt 226 lb 14.4 oz (102.9 kg)   BMI 29.13 kg/m    Constitutional:  Alert and oriented, No acute distress. Cardiovascular: No clubbing, cyanosis, or edema. Respiratory: Normal respiratory effort, no increased work of breathing. GI: Abdomen is soft, nontender,  nondistended, no abdominal masses GU: Vas deferens easily palpable bilaterally Lymph: No cervical or inguinal lymphadenopathy. Skin: No rashes, bruises or suspicious lesions. Neurologic: Grossly intact, no focal deficits, moving all 4 extremities. Psychiatric: Normal mood and affect.  Laboratory Data: None to review  Pertinent Imaging: None to review  Assessment & Plan:   In summary, Mr. Soltis is a 42 year old male who desires vasectomy.  We discussed the risks and benefits of vasectomy at length.  Vasectomy is intended to be a permanent form of contraception, and does not produce immediate sterility.  Following vasectomy another form of contraception is required until vas occlusion is confirmed by a post-vasectomy semen analysis obtained 2-3 months after the procedure.  Even after vas occlusion is confirmed, vasectomy is not 100% reliable in preventing pregnancy, and the failure rate is approximately 06/1998.  Repeat vasectomy is required in less than 1% of patients.  He should refrain from ejaculation for 1 week after vasectomy.  Options for fertility after vasectomy include vasectomy reversal, and sperm retrieval with in vitro fertilization or ICSI.  These options are not always successful and may be expensive.  Finally, there are other permanent and non-permanent alternatives to vasectomy available. There is no risk of erectile dysfunction, and the volume of semen will be similar to prior, as the majority of the ejaculate is from the prostate and seminal vesicles.   The procedure takes ~20 minutes.  We recommend patients take 5-10 mg of  Valium 30 minutes prior, and he will need a driver post-procedure.  Local anesthetic is injected into the scrotal skin and a small segment of the vas deferens is removed, and the ends occluded. The complication rate is approximately 1-2%, and includes bleeding, infection, and development of chronic scrotal pain.  PLAN: Pending insurance approval, will schedule  for vasectomy at his convenience    Sondra Come, MD  [facility] [address], [address] [place], [place] [address] [phone]

## 2018-05-01 IMAGING — CT CT ABD-PELV W/ CM
2 of 5 series · 14 of 46 positions shown, 16 images · IV contrast (APPLIED)
Comparison: None.

CLINICAL DATA: Acute onset of generalized abdominal pain and
nausea. Chills.

EXAM:
CT ABDOMEN AND PELVIS WITH CONTRAST
TECHNIQUE: Multidetector CT imaging of the abdomen and pelvis was performed
using the standard protocol following bolus administration of
intravenous contrast.
CONTRAST:  100mL MLV5HC-AHH IOPAMIDOL (MLV5HC-AHH) INJECTION 61%

[Series 2: routine abd/pel with · axial · 0.76mm/px · z∈[-993,-513]mm · 11 of 108 slices shown, 13 images]
[im 6/108  soft-tissue]
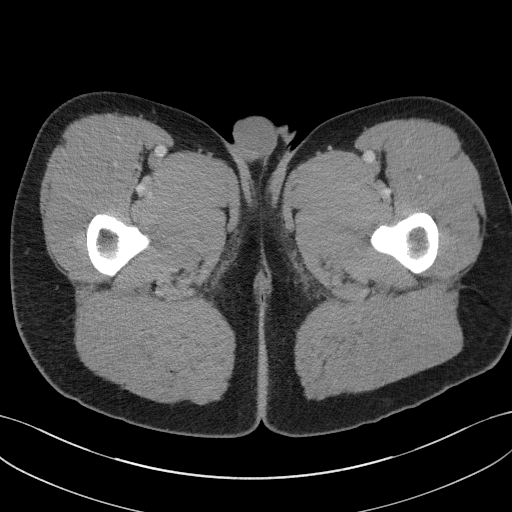
[im 6/108  bone]
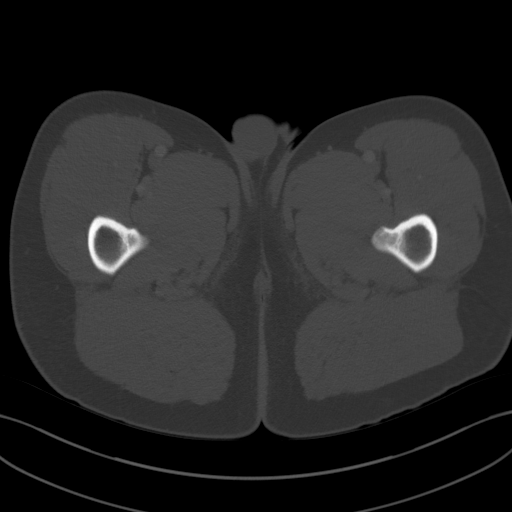
[im 17/108  soft-tissue]
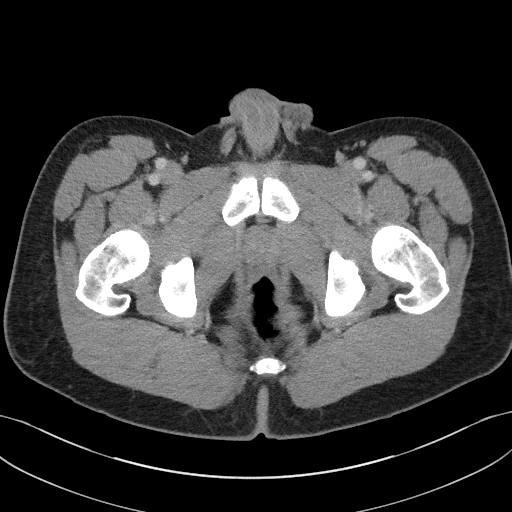
[im 29/108  soft-tissue]
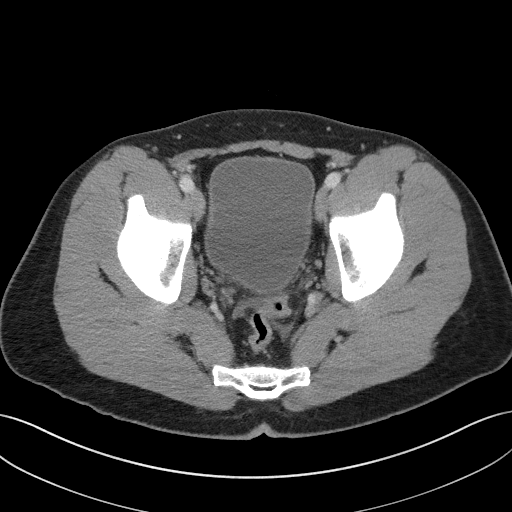
[im 34/108  soft-tissue]
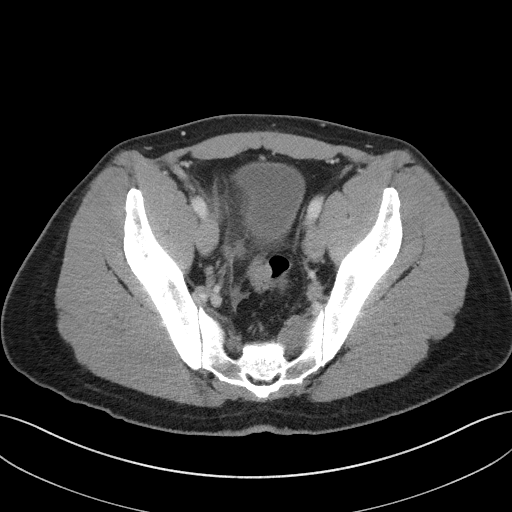
[im 46/108  soft-tissue]
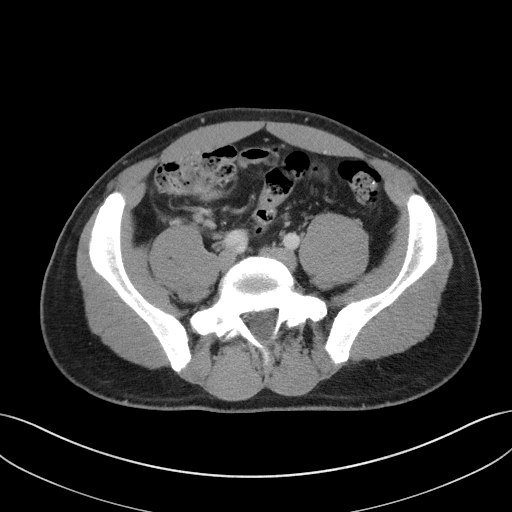
[im 57/108  soft-tissue]
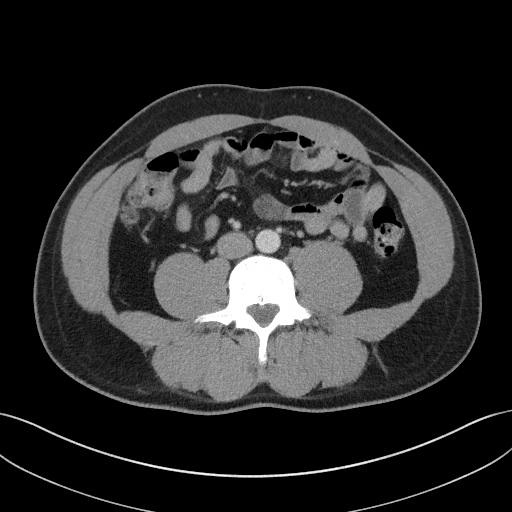
[im 62/108  soft-tissue]
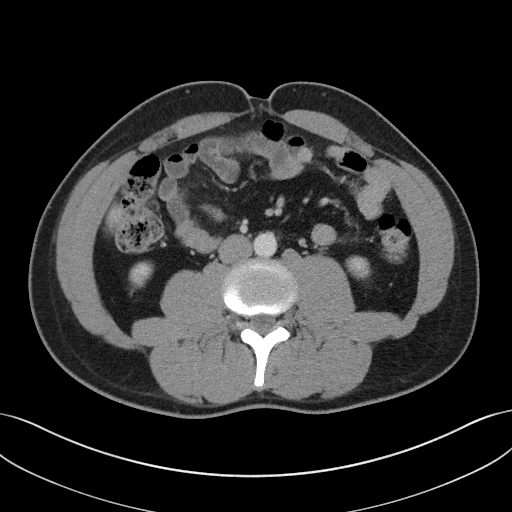
[im 74/108  soft-tissue]
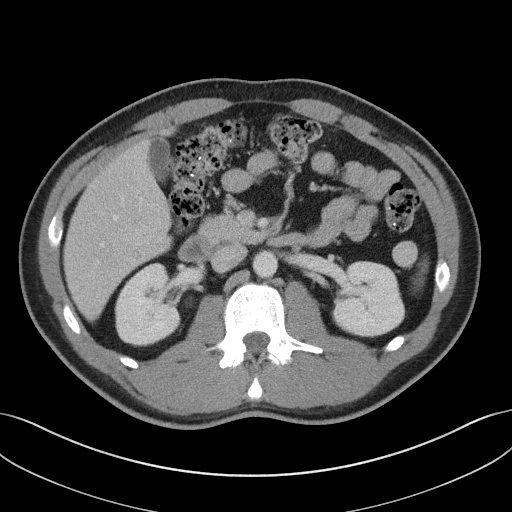
[im 79/108  soft-tissue]
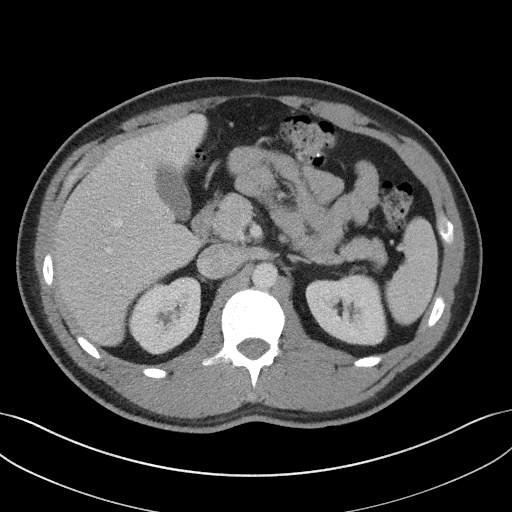
[im 79/108  bone]
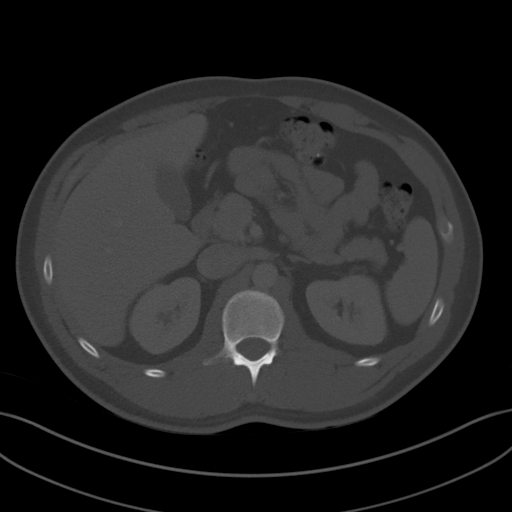
[im 91/108  soft-tissue]
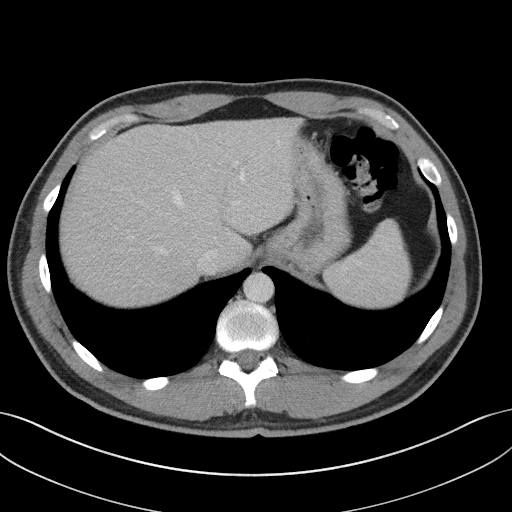
[im 102/108  soft-tissue]
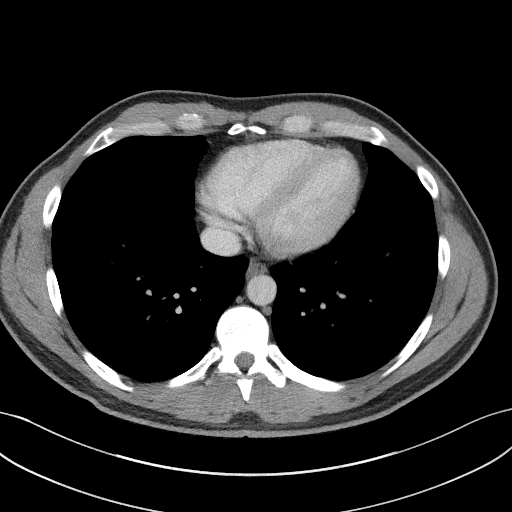

[Series 6: coronal st · coronal · 0.69mm/px · 3 of 89 slices shown]
[im 30/89  soft-tissue]
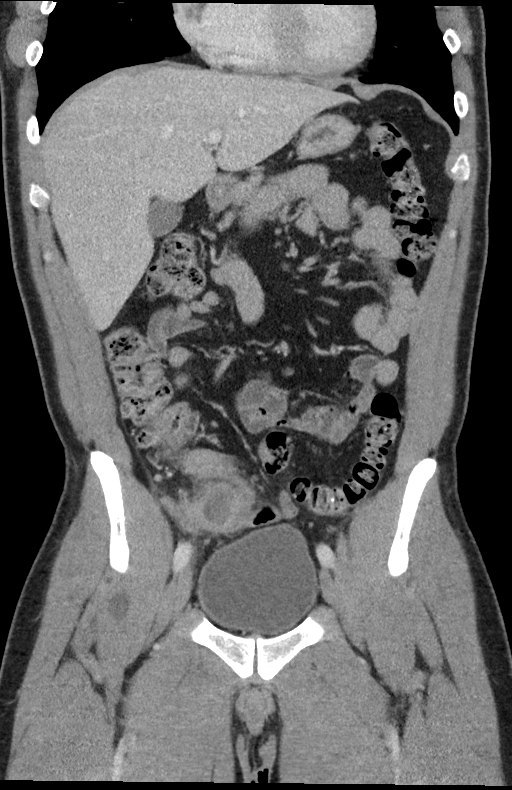
[im 40/89  soft-tissue]
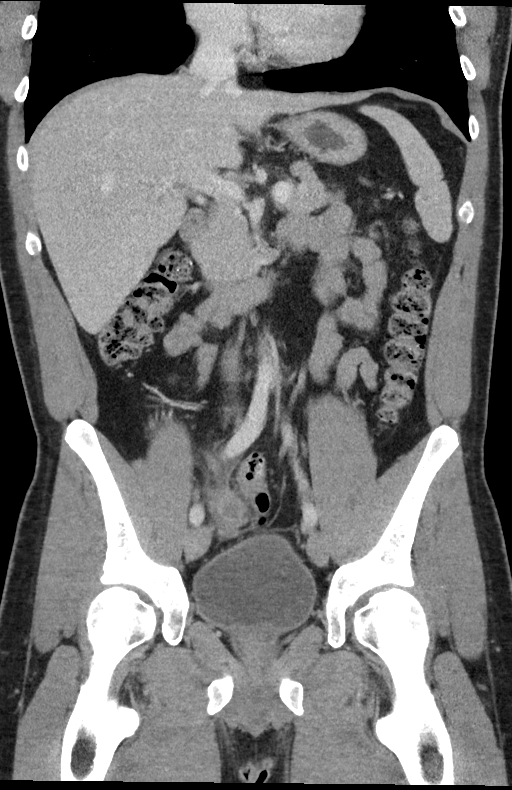
[im 49/89  soft-tissue]
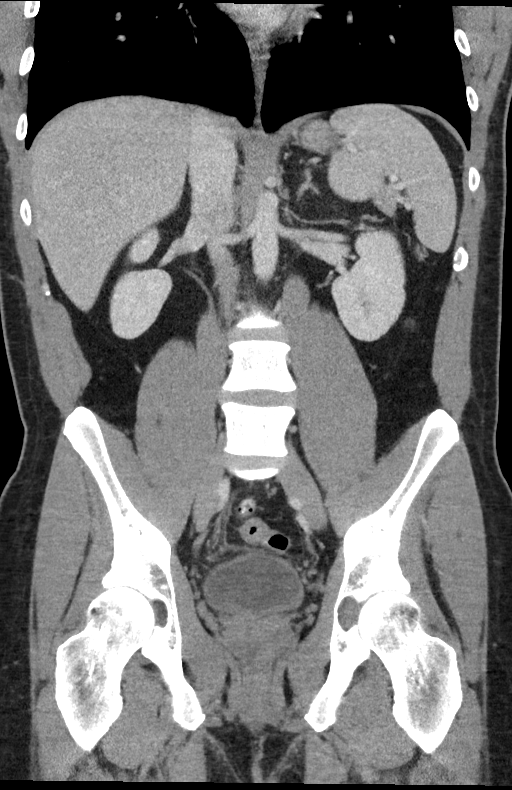

[14 of 46 positions shown; findings below may reference images not displayed]

FINDINGS: Lower chest: The visualized lung bases are grossly clear. The
visualized portions of the mediastinum are unremarkable.

Hepatobiliary: The liver is unremarkable in appearance. The
gallbladder is unremarkable in appearance. The common bile duct
remains normal in caliber.

Pancreas: The pancreas is within normal limits.

Spleen: The spleen is unremarkable in appearance.

Adrenals/Urinary Tract: The adrenal glands are unremarkable in
appearance. The kidneys are within normal limits. There is no
evidence of hydronephrosis. No renal or ureteral stones are
identified. No perinephric stranding is seen.

Stomach/Bowel: There is a focal 5.0 x 3.6 x 3.0 cm peripherally
enhancing abscess at the upper right hemipelvis, with diffuse
surrounding soft tissue inflammation. This most likely reflects a
perforated appendicitis. No free air is seen. An appendicolith is
suggested adjacent to the collection, measuring 4 mm in size.

The appendix is not well characterized due to the abscess.

Appendix: Location: Upper right hemipelvis, medial to the cecum.

Diameter: Not well seen.

Appendicolith: Yes

Mucosal hyper-enhancement: Yes

Extraluminal gas: No

Periappendiceal collection: Yes

The colon is unremarkable in appearance. Trace free fluid is noted
within the pelvis. The small bowel is grossly unremarkable. The
stomach is within normal limits.

Vascular/Lymphatic: The abdominal aorta is unremarkable in
appearance. The inferior vena cava is grossly unremarkable. No
retroperitoneal lymphadenopathy is seen. No pelvic sidewall
lymphadenopathy is identified.

Reproductive: The bladder is moderately distended and grossly
unremarkable. The prostate remains normal in size.

Other: No additional soft tissue abnormalities are seen.

Musculoskeletal: No acute osseous abnormalities are identified. The
visualized musculature is unremarkable in appearance.
IMPRESSION: 5.0 x 3.6 x 3.0 cm peripherally enhancing abscess in the upper right
hemipelvis, with diffuse surrounding soft tissue inflammation. This
most likely reflects a perforated appendicitis. Adjacent
appendicolith suggested, measuring 4 mm in size. No free
intraperitoneal air seen.

Critical Value/emergent results were called by telephone at the time
of interpretation on 07/15/2017 at [DATE] to Dr. GITA MORIS, who
verbally acknowledged these results.

## 2018-05-09 ENCOUNTER — Other Ambulatory Visit: Payer: Self-pay

## 2018-05-11 ENCOUNTER — Ambulatory Visit (INDEPENDENT_AMBULATORY_CARE_PROVIDER_SITE_OTHER): Payer: BLUE CROSS/BLUE SHIELD | Admitting: Urology

## 2018-05-11 ENCOUNTER — Telehealth: Payer: Self-pay | Admitting: Urology

## 2018-05-11 ENCOUNTER — Other Ambulatory Visit: Payer: Self-pay

## 2018-05-11 ENCOUNTER — Encounter: Payer: BLUE CROSS/BLUE SHIELD | Admitting: Urology

## 2018-05-11 DIAGNOSIS — Z302 Encounter for sterilization: Secondary | ICD-10-CM

## 2018-05-11 MED ORDER — DIAZEPAM 10 MG PO TABS: 10.0000 mg | ORAL_TABLET | Freq: Once | ORAL | 0 refills | Status: AC

## 2018-05-11 NOTE — Progress Notes (Signed)
VASECTOMY PROCEDURE NOTE:  The patient was taken to the minor procedure room and placed in the supine position. His genitals were prepped and draped in the usual sterile fashion. The left vas deferens was brought up to the skin of the left upper scrotum. The skin overlying it was anesthetized with 1% lidocaine without epinephrine, anesthetic was also injected alongside the vas deferens in the direction of the inguinal canal. The no scalpel vasectomy instrument was used to make a small perforation in the scrotal skin. The vasectomy clamp was used to grasp the vas deferens. It was carefully dissected free from surrounding structures. A 1cm segment of the vas was removed, and the cut ends of the mucosa were cauterized. No significant bleeding was noted. The vas deferens was returned to the scrotum. The skin incision was closed with a simple interrupted stitch of 4-0 chromic.  Attention was then turned to the right side. The right vasectomy was performed in the same exact fashion. A figure of 8 stitch was placed around a small bleeding vessel alongside the vas. Sterile dressings were placed over each incision. The patient tolerated the procedure well.  IMPRESSION/DIAGNOSIS: The patient is a 42 year old gentleman who underwent a vasectomy today. Post-procedure instructions were reviewed. I stressed the importance of continuing to use birth control until he provides a semen specimen more than 2 months from now that demonstrates azoospermia.  We discussed return precautions including fever over 101, significant bleeding or hematoma, or uncontrolled pain. I also stressed the importance of avoiding strenuous activity for one week, no sexual activity or ejaculations for 5 days, intermittent icing over the next 48 hours, and scrotal support.   PLAN: The patient will be advised of his semen analysis results when available.  Legrand RamsBrian Sninsky, MD 05/11/2018

## 2018-05-19 ENCOUNTER — Other Ambulatory Visit: Payer: Self-pay

## 2018-05-19 ENCOUNTER — Encounter: Payer: Self-pay | Admitting: *Deleted

## 2018-05-19 NOTE — Discharge Instructions (Signed)
[facility] °[facility] °ENDOSCOPIC SINUS SURGERY °[facility] ° °What is Functional Endoscopic Sinus Surgery? ° The Surgery involves making the natural openings of the sinuses larger by removing the bony partitions that separate the sinuses from the nasal cavity.  The natural sinus lining is preserved as much as possible to allow the sinuses to resume normal function after the surgery.  In some patients nasal polyps (excessively swollen lining of the sinuses) may be removed to relieve obstruction of the sinus openings.  The surgery is performed through the nose using lighted scopes, which eliminates the need for incisions on the face.  A septoplasty is a different procedure which is sometimes performed with sinus surgery.  It involves straightening the boy partition that separates the two sides of your nose.  A crooked or deviated septum may need repair if is obstructing the sinuses or nasal airflow.  Turbinate reduction is also often performed during sinus surgery.  The turbinates are bony proturberances from the side walls of the nose which swell and can obstruct the nose in patients with sinus and allergy problems.  Their size can be surgically reduced to help relieve nasal obstruction. ° °What Can Sinus Surgery Do For Me? ° Sinus surgery can reduce the frequency of sinus infections requiring antibiotic treatment.  This can provide improvement in nasal congestion, post-nasal drainage, facial pressure and nasal obstruction.  Surgery will NOT prevent you from ever having an infection again, so it usually only for patients who get infections 4 or more times yearly requiring antibiotics, or for infections that do not clear with antibiotics.  It will not cure nasal allergies, so patients with allergies may still require medication to treat their allergies after surgery. Surgery may improve headaches related to sinusitis, however, some people will continue to  require medication to control sinus headaches related to allergies.  Surgery will do nothing for other forms of headache (migraine, tension or cluster). ° °What Are the Risks of Endoscopic Sinus Surgery? ° Current techniques allow surgery to be performed safely with little risk, however, there are rare complications that patients should be aware of.  Because the sinuses are located around the eyes, there is risk of eye injury, including blindness, though again, this would be quite rare. This is usually a result of bleeding behind the eye during surgery, which puts the vision oat risk, though there are treatments to protect the vision and prevent permanent disrupted by surgery causing a leak of the spinal fluid that surrounds the brain.  More serious complications would include bleeding inside the brain cavity or damage to the brain.  Again, all of these complications are uncommon, and spinal fluid leaks can be safely managed surgically if they occur.  The most common complication of sinus surgery is bleeding from the nose, which may require packing or cauterization of the nose.  Continued sinus have polyps may experience recurrence of the polyps requiring revision surgery.  Alterations of sense of smell or injury to the tear ducts are also rare complications.  ° °What is the Surgery Like, and what is the Recovery? ° The Surgery usually takes a couple of hours to perform, and is usually performed under a general anesthetic (completely asleep).  Patients are usually discharged home after a couple of hours.  Sometimes during surgery it is necessary to pack the nose to control bleeding, and the packing is left in place for 24 - 48 hours, and removed by your surgeon.    If a septoplasty was performed during the procedure, there is often a splint placed which must be removed after 5-7 days.   °Discomfort: Pain is usually mild to moderate, and can be controlled by prescription pain medication or acetaminophen (Tylenol).   Aspirin, Ibuprofen (Advil, Motrin), or Naprosyn (Aleve) should be avoided, as they can cause increased bleeding.  Most patients feel sinus pressure like they have a bad head cold for several days.  Sleeping with your head elevated can help reduce swelling and facial pressure, as can ice packs over the face.  A humidifier may be helpful to keep the mucous and blood from drying in the nose.  ° °Diet: There are no specific diet restrictions, however, you should generally start with clear liquids and a light diet of bland foods because the anesthetic can cause some nausea.  Advance your diet depending on how your stomach feels.  Taking your pain medication with food will often help reduce stomach upset which pain medications can cause. ° °Nasal Saline Irrigation: It is important to remove blood clots and dried mucous from the nose as it is healing.  This is done by having you irrigate the nose at least 3 - 4 times daily with a salt water solution.  We recommend using NeilMed Sinus Rinse (available at the drug store).  Fill the squeeze bottle with the solution, bend over a sink, and insert the tip of the squeeze bottle into the nose ½ of an inch.  Point the tip of the squeeze bottle towards the inside corner of the eye on the same side your irrigating.  Squeeze the bottle and gently irrigate the nose.  If you bend forward as you do this, most of the fluid will flow back out of the nose, instead of down your throat.   The solution should be warm, near body temperature, when you irrigate.   Each time you irrigate, you should use a full squeeze bottle.  ° °Note that if you are instructed to use Nasal Steroid Sprays at any time after your surgery, irrigate with saline BEFORE using the steroid spray, so you do not wash it all out of the nose. °Another product, Nasal Saline Gel (such as AYR Nasal Saline Gel) can be applied in each nostril 3 - 4 times daily to moisture the nose and reduce scabbing or crusting. ° °Bleeding:   Bloody drainage from the nose can be expected for several days, and patients are instructed to irrigate their nose frequently with salt water to help remove mucous and blood clots.  The drainage may be dark red or brown, though some fresh blood may be seen intermittently, especially after irrigation.  Do not blow you nose, as bleeding may occur. If you must sneeze, keep your mouth open to allow air to escape through your mouth. ° °If heavy bleeding occurs: Irrigate the nose with saline to rinse out clots, then spray the nose 3 - 4 times with Afrin Nasal Decongestant Spray.  The spray will constrict the blood vessels to slow bleeding.  Pinch the lower half of your nose shut to apply pressure, and lay down with your head elevated.  Ice packs over the nose may help as well. If bleeding persists despite these measures, you should notify your doctor.  Do not use the Afrin routinely to control nasal congestion after surgery, as it can result in worsening congestion and may affect healing.  ° ° ° °Activity: Return to work varies among patients. Most patients will be   out of work at least 5 - 7 days to recover.  Patient may return to work after they are off of narcotic pain medication, and feeling well enough to perform the functions of their job.  Patients must avoid heavy lifting (over 10 pounds) or strenuous physical for 2 weeks after surgery, so your employer may need to assign you to light duty, or keep you out of work longer if light duty is not possible.  NOTE: you should not drive, operate dangerous machinery, do any mentally demanding tasks or make any important legal or financial decisions while on narcotic pain medication and recovering from the general anesthetic.  °  °Call Your Doctor Immediately if You Have Any of the Following: °1. Bleeding that you cannot control with the above measures °2. Loss of vision, double vision, bulging of the eye or black eyes. °3. Fever over 101 degrees °4. Neck stiffness with  severe headache, fever, nausea and change in mental state. °You are always encourage to call anytime with concerns, however, please call with requests for pain medication refills during office hours. ° °Office Endoscopy: During follow-up visits your doctor will remove any packing or splints that may have been placed and evaluate and clean your sinuses endoscopically.  Topical anesthetic will be used to make this as comfortable as possible, though you may want to take your pain medication prior to the visit.  How often this will need to be done varies from patient to patient.  After complete recovery from the surgery, you may need follow-up endoscopy from time to time, particularly if there is concern of recurrent infection or nasal polyps. ° ° °General Anesthesia, Adult, Care After °These instructions provide you with information about caring for yourself after your procedure. Your health care provider may also give you more specific instructions. Your treatment has been planned according to current medical practices, but problems sometimes occur. Call your health care provider if you have any problems or questions after your procedure. °What can I expect after the procedure? °After the procedure, it is common to have: °· Vomiting. °· A sore throat. °· Mental slowness. ° °It is common to feel: °· Nauseous. °· Cold or shivery. °· Sleepy. °· Tired. °· Sore or achy, even in parts of your body where you did not have surgery. ° °Follow these instructions at home: °For at least 24 hours after the procedure: °· Do not: °? Participate in activities where you could fall or become injured. °? Drive. °? Use heavy machinery. °? Drink alcohol. °? Take sleeping pills or medicines that cause drowsiness. °? Make important decisions or sign legal documents. °? Take care of children on your own. °· Rest. °Eating and drinking °· If you vomit, drink water, juice, or soup when you can drink without vomiting. °· Drink enough fluid to  keep your urine clear or pale yellow. °· Make sure you have little or no nausea before eating solid foods. °· Follow the diet recommended by your health care provider. °General instructions °· Have a responsible adult stay with you until you are awake and alert. °· Return to your normal activities as told by your health care provider. Ask your health care provider what activities are safe for you. °· Take over-the-counter and prescription medicines only as told by your health care provider. °· If you smoke, do not smoke without supervision. °· Keep all follow-up visits as told by your health care provider. This is important. °Contact a health care provider if: °· You   continue to have nausea or vomiting at home, and medicines are not helpful. °· You cannot drink fluids or start eating again. °· You cannot urinate after 8-12 hours. °· You develop a skin rash. °· You have fever. °· You have increasing redness at the site of your procedure. °Get help right away if: °· You have difficulty breathing. °· You have chest pain. °· You have unexpected bleeding. °· You feel that you are having a life-threatening or urgent problem. °This information is not intended to replace advice given to you by your health care provider. Make sure you discuss any questions you have with your health care provider. °Document Released: 08/31/2000 Document Revised: 10/28/2015 Document Reviewed: 05/09/2015 °Elsevier Interactive Patient Education © 2018 [employer] ° °

## 2018-05-23 NOTE — Anesthesia Preprocedure Evaluation (Addendum)
Anesthesia Evaluation  Patient identified by MRN, date of birth, ID band Patient awake    Reviewed: Allergy & Precautions, NPO status , Patient's Chart, lab work & pertinent test results  History of Anesthesia Complications Negative for: history of anesthetic complications  Airway Mallampati: III   Neck ROM: Full    Dental no notable dental hx.    Pulmonary neg pulmonary ROS,    Pulmonary exam normal breath sounds clear to auscultation       Cardiovascular Exercise Tolerance: Good + Valvular Problems/Murmurs (bicuspid aortic valve; murmur)  Rhythm:Regular Rate:Normal + Systolic murmurs Echo 10/01/17:   NORMAL LEFT VENTRICULAR SYSTOLIC FUNCTION  NORMAL LA PRESSURES WITH NORMAL DIASTOLIC FUNCTION  NORMAL RIGHT VENTRICULAR SYSTOLIC FUNCTION  VALVULAR REGURGITATION: TRIVIAL MR  NO VALVULAR STENOSIS  BICUSPID AORTIC VALVE  NO EVIDENCE OF ANY OTHER COEXISTING LESIONS  NO PRIOR STUDY FOR COMPARISON   Neuro/Psych negative neurological ROS     GI/Hepatic negative GI ROS,   Endo/Other  negative endocrine ROS  Renal/GU negative Renal ROS     Musculoskeletal   Abdominal   Peds  Hematology  (+) Blood dyscrasia, anemia ,   Anesthesia Other Findings   Reproductive/Obstetrics                            Anesthesia Physical Anesthesia Plan  ASA: II  Anesthesia Plan: General   Post-op Pain Management:    Induction: Intravenous  PONV Risk Score and Plan: 2 and Dexamethasone and Ondansetron  Airway Management Planned: Oral ETT  Additional Equipment:   Intra-op Plan:   Post-operative Plan: Extubation in OR  Informed Consent: I have reviewed the patients History and Physical, chart, labs and discussed the procedure including the risks, benefits and alternatives for the proposed anesthesia with the patient or authorized representative who has indicated his/her understanding and  acceptance.     Plan Discussed with: CRNA  Anesthesia Plan Comments:        Anesthesia Quick Evaluation

## 2018-05-24 ENCOUNTER — Encounter
Admission: RE | Disposition: A | Discharge status: Home / Self Care | Payer: Self-pay | Source: Ambulatory Visit | Attending: Otolaryngology

## 2018-05-24 ENCOUNTER — Ambulatory Visit
Admission: RE | Admit: 2018-05-24 | Discharge: 2018-05-24 | Disposition: A | Discharge status: Home / Self Care | Payer: BLUE CROSS/BLUE SHIELD | Source: Ambulatory Visit | Attending: Otolaryngology | Admitting: Otolaryngology

## 2018-05-24 ENCOUNTER — Ambulatory Visit: Payer: BLUE CROSS/BLUE SHIELD | Admitting: Anesthesiology

## 2018-05-24 DIAGNOSIS — J343 Hypertrophy of nasal turbinates: Secondary | ICD-10-CM | POA: Insufficient documentation

## 2018-05-24 DIAGNOSIS — J3489 Other specified disorders of nose and nasal sinuses: Secondary | ICD-10-CM | POA: Insufficient documentation

## 2018-05-24 DIAGNOSIS — J342 Deviated nasal septum: Secondary | ICD-10-CM | POA: Insufficient documentation

## 2018-05-24 HISTORY — PX: SEPTOPLASTY: SHX2393

## 2018-05-24 HISTORY — DX: Cardiac murmur, unspecified: R01.1

## 2018-05-24 HISTORY — PX: TURBINATE REDUCTION: SHX6157

## 2018-05-24 SURGERY — SEPTOPLASTY, NOSE
Anesthesia: General | Site: Nose | Laterality: Bilateral

## 2018-05-24 MED ORDER — SUCCINYLCHOLINE CHLORIDE 20 MG/ML IJ SOLN
INTRAMUSCULAR | Status: DC | PRN
  Administered 2018-05-24: 100 mg via INTRAVENOUS

## 2018-05-24 MED ORDER — PROPOFOL 10 MG/ML IV BOLUS
INTRAVENOUS | Status: DC | PRN
  Administered 2018-05-24: 200 mg via INTRAVENOUS

## 2018-05-24 MED ORDER — HYDROCODONE-ACETAMINOPHEN 5-325 MG PO TABS: 1.0000 | ORAL_TABLET | ORAL | 0 refills | Status: AC | PRN

## 2018-05-24 MED ORDER — ACETAMINOPHEN 10 MG/ML IV SOLN: 1000.0000 mg | Freq: Once | INTRAVENOUS | Status: DC | PRN

## 2018-05-24 MED ORDER — LIDOCAINE HCL (CARDIAC) PF 100 MG/5ML IV SOSY
PREFILLED_SYRINGE | INTRAVENOUS | Status: DC | PRN
  Administered 2018-05-24: 30 mg via INTRAVENOUS

## 2018-05-24 MED ORDER — OXYCODONE HCL 5 MG PO TABS
5.0000 mg | ORAL_TABLET | Freq: Once | ORAL | Status: AC | PRN
  Administered 2018-05-24: 5 mg via ORAL

## 2018-05-24 MED ORDER — ONDANSETRON HCL 4 MG/2ML IJ SOLN
INTRAMUSCULAR | Status: DC | PRN
  Administered 2018-05-24: 4 mg via INTRAVENOUS

## 2018-05-24 MED ORDER — CEFPROZIL 500 MG PO TABS: 500.0000 mg | ORAL_TABLET | Freq: Two times a day (BID) | ORAL | 0 refills | Status: AC

## 2018-05-24 MED ORDER — GLYCOPYRROLATE 0.2 MG/ML IJ SOLN
INTRAMUSCULAR | Status: DC | PRN
  Administered 2018-05-24: 0.1 mg via INTRAVENOUS

## 2018-05-24 MED ORDER — ONDANSETRON HCL 4 MG/2ML IJ SOLN: 4.0000 mg | Freq: Once | INTRAMUSCULAR | Status: DC | PRN

## 2018-05-24 MED ORDER — LACTATED RINGERS IV SOLN
INTRAVENOUS | Status: DC
  Administered 2018-05-24: 09:00:00 via INTRAVENOUS

## 2018-05-24 MED ORDER — OXYCODONE HCL 5 MG/5ML PO SOLN: 5.0000 mg | Freq: Once | ORAL | Status: AC | PRN

## 2018-05-24 MED ORDER — FENTANYL CITRATE (PF) 100 MCG/2ML IJ SOLN: 25.0000 ug | INTRAMUSCULAR | Status: DC | PRN

## 2018-05-24 MED ORDER — ACETAMINOPHEN 10 MG/ML IV SOLN
1000.0000 mg | Freq: Once | INTRAVENOUS | Status: AC
  Administered 2018-05-24: 1000 mg via INTRAVENOUS

## 2018-05-24 MED ORDER — LACTATED RINGERS IV SOLN: INTRAVENOUS | Status: DC

## 2018-05-24 MED ORDER — LIDOCAINE-EPINEPHRINE 1 %-1:100000 IJ SOLN
INTRAMUSCULAR | Status: DC | PRN
  Administered 2018-05-24: 9 mL

## 2018-05-24 MED ORDER — FENTANYL CITRATE (PF) 100 MCG/2ML IJ SOLN
INTRAMUSCULAR | Status: DC | PRN
  Administered 2018-05-24: 50 ug via INTRAVENOUS

## 2018-05-24 MED ORDER — OXYMETAZOLINE HCL 0.05 % NA SOLN
NASAL | Status: DC | PRN
  Administered 2018-05-24: 1 via TOPICAL

## 2018-05-24 MED ORDER — DEXAMETHASONE SODIUM PHOSPHATE 4 MG/ML IJ SOLN
INTRAMUSCULAR | Status: DC | PRN
  Administered 2018-05-24: 8 mg via INTRAVENOUS

## 2018-05-24 MED ORDER — MIDAZOLAM HCL 5 MG/5ML IJ SOLN
INTRAMUSCULAR | Status: DC | PRN
  Administered 2018-05-24: 2 mg via INTRAVENOUS

## 2018-05-24 SURGICAL SUPPLY — 19 items
COAG SUCT 10F 3.5MM HAND CTRL (MISCELLANEOUS) ×3 IMPLANT
DRAPE HEAD BAR (DRAPES) ×3 IMPLANT
ELECT REM PT RETURN 9FT ADLT (ELECTROSURGICAL) ×3
ELECTRODE REM PT RTRN 9FT ADLT (ELECTROSURGICAL) ×1 IMPLANT
GLOVE BIO SURGEON STRL SZ7.5 (GLOVE) ×6 IMPLANT
HANDLE YANKAUER SUCT BULB TIP (MISCELLANEOUS) ×3 IMPLANT
KIT TURNOVER KIT A (KITS) ×3 IMPLANT
NEEDLE HYPO 25GX1X1/2 BEV (NEEDLE) ×3 IMPLANT
PACK ENT CUSTOM (PACKS) ×3 IMPLANT
PATTIES SURGICAL .5 X3 (DISPOSABLE) ×3 IMPLANT
SPLINT NASAL SEPTAL BLV .25 LG (MISCELLANEOUS) ×3 IMPLANT
STRAP BODY AND KNEE 60X3 (MISCELLANEOUS) ×3 IMPLANT
SUT ETHILON 4 0 FS (SUTURE) ×3 IMPLANT
SUT PLAIN GUT 4-0 (SUTURE) ×3 IMPLANT
SUT PLAIN GUT FAST 5-0 (SUTURE) ×3 IMPLANT
SYR 10ML LL (SYRINGE) ×3 IMPLANT
TOWEL OR 17X26 4PK STRL BLUE (TOWEL DISPOSABLE) ×3 IMPLANT
WAND COBLATOR ENT REFLEX ULT45 (MISCELLANEOUS) ×3 IMPLANT
WATER STERILE IRR 250ML POUR (IV SOLUTION) ×3 IMPLANT

## 2018-05-24 NOTE — Transfer of Care (Signed)
Immediate Anesthesia Transfer of Care Note  Patient: Edward Kaufman  Procedure(s) Performed: SEPTOPLASTY (Bilateral Nose) TURBINATE REDUCTION cauterization (Bilateral Nose)  Patient Location: PACU  Anesthesia Type: General  Level of Consciousness: awake, alert  and patient cooperative  Airway and Oxygen Therapy: Patient Spontanous Breathing and Patient connected to supplemental oxygen  Post-op Assessment: Post-op Vital signs reviewed, Patient's Cardiovascular Status Stable, Respiratory Function Stable, Patent Airway and No signs of Nausea or vomiting  Post-op Vital Signs: Reviewed and stable  Complications: No apparent anesthesia complications

## 2018-05-24 NOTE — H&P (Signed)
History and physical reviewed and will be scanned in later. No change in medical status reported by the patient or family, appears stable for surgery. All questions regarding the procedure answered, and patient (or family if a child) expressed understanding of the procedure. ? ?Edward Kaufman ?@[email or web address]@ ?

## 2018-05-24 NOTE — Anesthesia Procedure Notes (Signed)
Procedure Name: Intubation Date/Time: 05/24/2018 9:42 AM Performed by: Cameron Ali, CRNA Pre-anesthesia Checklist: Patient identified, Emergency Drugs available, Suction available, Patient being monitored and Timeout performed Patient Re-evaluated:Patient Re-evaluated prior to induction Oxygen Delivery Method: Circle system utilized Preoxygenation: Pre-oxygenation with 100% oxygen Induction Type: IV induction Ventilation: Mask ventilation without difficulty Laryngoscope Size: Mac and 3 Grade View: Grade I Tube type: Oral Rae Tube size: 7.5 mm Number of attempts: 1 Placement Confirmation: ETT inserted through vocal cords under direct vision,  positive ETCO2 and breath sounds checked- equal and bilateral Tube secured with: Tape Dental Injury: Teeth and Oropharynx as per pre-operative assessment

## 2018-05-24 NOTE — Op Note (Signed)
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 Hypertrophy of the inferior turbinates  Procedure: The patient was taken to the Operating Room and placed in the supine position.  After induction of general endotracheal anesthesia, the table was turned 90 degrees. A time-out was issued to confirm the site and procedure. The nasal septum and inferior turbinates where then injected with 1% lidocaine with epiniephrine, 1:100,000. The nose was decongested with Afrin soaked pledgets. The patient was then prepped and draped in the usual sterile fashion. Beginning on the left hand side a hemitransfixion incision was then created on the leading edge of the septum on the left.  A subperichondrial plane was elevated posteriorly on the left and taken back to the perpendicular plate of the ethmoid where subperiosteal plane was elevated posteriorly on the left. A large septal spur was identified on the right hand side.  An inferior rim of redundant septal cartilage was removed from where it deviated over the maxillary crest. The perpendicular plate of the ethmoid was separated from the quadrangular cartilage, and a subperiosteal plane elevated on the right of the bony septum. The large septal spur was removed, dividing the septal bone superiorly with Knight scissors, and inferiorly from the maxillary crest with a chisel. Midseptal deviated cartilage was conservatively resected maintaining a generous caudal and dorsal strut for support. The caudal septum was deformed, deviating to the left. The cartilage was scored  repeatedly with a 15 blade to soften it and allow it to relax into the midline. A columellar pocket was created to help secure the caudal septum midline.   The septum was then replaced in the midline. Reinspection through each nostril showed excellent reduction of the septal deformity. A left posterior inferior fenestration was then created to allow hematoma drainage.  The septal incision was closed with 5-0 fast absorbing gut suture. A 4-0 plain gut suture was used to reapproximate the septal flaps to the underlying cartilage, utilizing a running, quilting type stitch.   With the septoplasty completed, the left inferior turbinate was conservatively reduced utilizing submucous coblation on a setting of 4 watts, making two passes through the tissue for 8-10 seconds each. The same procedure was performed on the right inferior turbinate.  Next nasal septal splints were placed within each nostril and affixed to the septum using a 4-0 Ethilon suture.     The patient was then returned to the anesthesiologist for awakening, and was taken to the Recovery Room in stable condition.  Disposition:   PACU to home  Plan: Take pain medications and antibiotics as prescribed. No strenuous activity for 2 weeks. Follow-up next Monday for splint removal.  Sandi Mealyaul S Desree Leap 05/24/2018 11:06 AM

## 2018-05-24 NOTE — Anesthesia Postprocedure Evaluation (Signed)
Anesthesia Post Note  Patient: Edward Kaufman  Procedure(s) Performed: SEPTOPLASTY (Bilateral Nose) TURBINATE REDUCTION cauterization (Bilateral Nose)  Patient location during evaluation: PACU Anesthesia Type: General Level of consciousness: awake and alert, oriented and patient cooperative Pain management: pain level controlled Vital Signs Assessment: post-procedure vital signs reviewed and stable Respiratory status: spontaneous breathing, nonlabored ventilation and respiratory function stable Cardiovascular status: blood pressure returned to baseline and stable Postop Assessment: adequate PO intake Anesthetic complications: no    Reed BreechAndrea Miguel Medal

## 2018-05-25 ENCOUNTER — Encounter: Payer: Self-pay | Admitting: Otolaryngology

## 2018-06-24 NOTE — Telephone Encounter (Signed)
error 

## 2018-08-05 ENCOUNTER — Encounter: Payer: Self-pay | Admitting: Urology

## 2018-08-05 ENCOUNTER — Other Ambulatory Visit: Payer: BLUE CROSS/BLUE SHIELD

## 2019-01-27 IMAGING — CT CT ABD-PELV W/ CM
2 of 5 series · 15 of 46 positions shown, 17 images · IV contrast (omnipaque)
Comparison: 07/15/2017

CLINICAL DATA: History of appendiceal abscess. Right lower quadrant
abdominal pain for the past 3 days.

EXAM:
CT ABDOMEN AND PELVIS WITH CONTRAST
TECHNIQUE: Multidetector CT imaging of the abdomen and pelvis was performed
using the standard protocol following bolus administration of
intravenous contrast.
CONTRAST:  100mL OMNIPAQUE IOHEXOL 300 MG/ML  SOLN

[Series 2: axial st · axial · 0.77mm/px · z∈[-533,-73]mm · 12 of 104 slices shown, 14 images]
[im 6/104  soft-tissue]
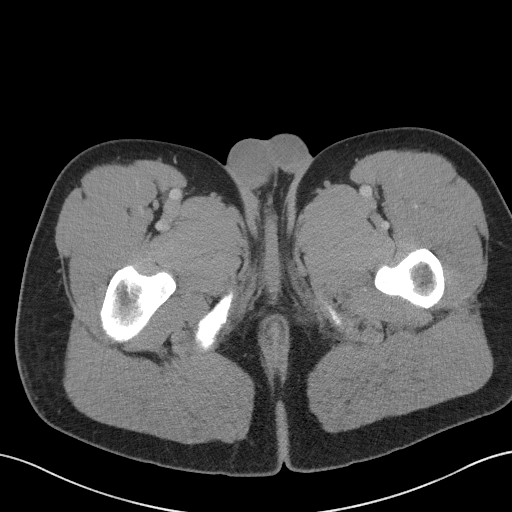
[im 6/104  bone]
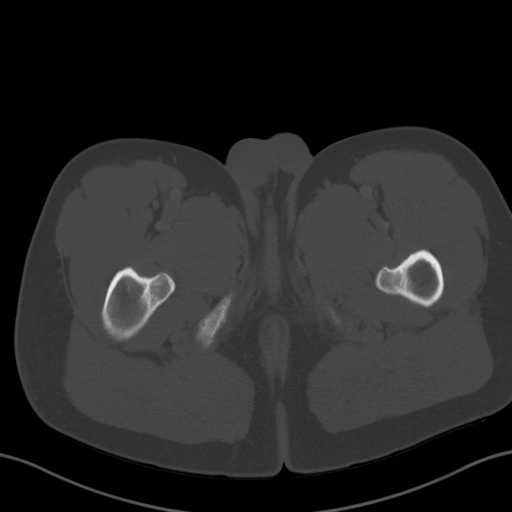
[im 18/104  soft-tissue]
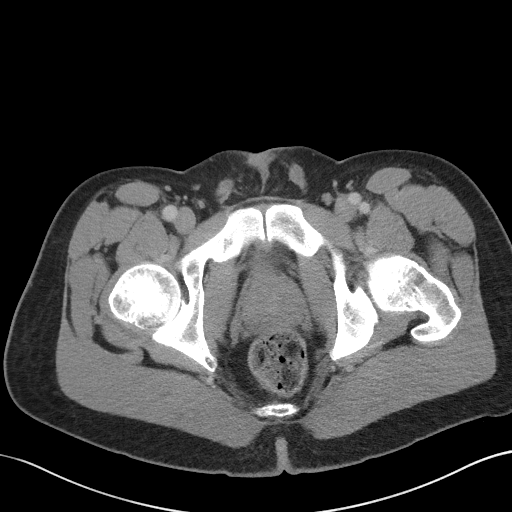
[im 23/104  soft-tissue]
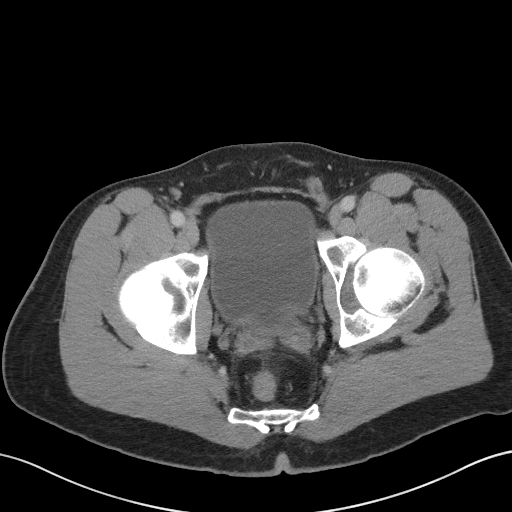
[im 29/104  soft-tissue]
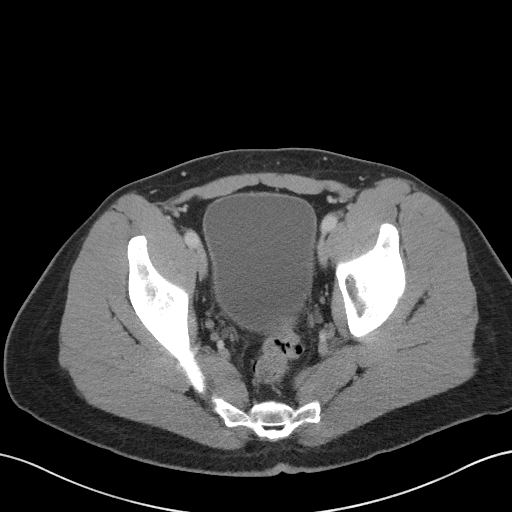
[im 41/104  soft-tissue]
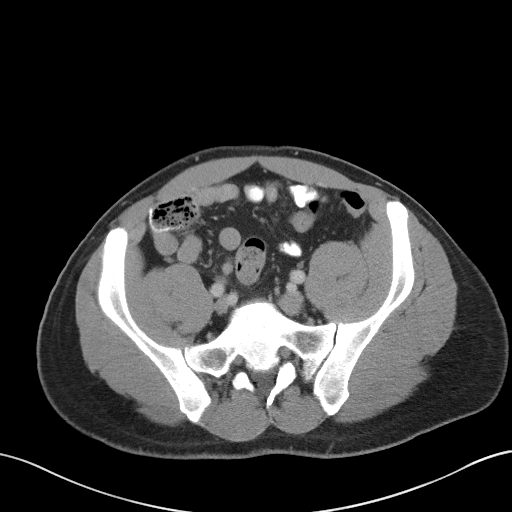
[im 46/104  soft-tissue]
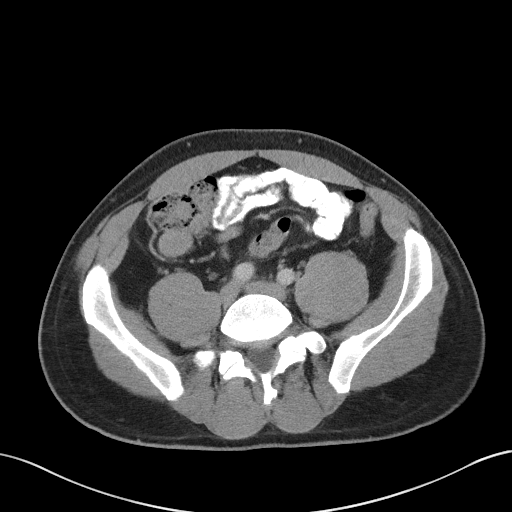
[im 58/104  soft-tissue]
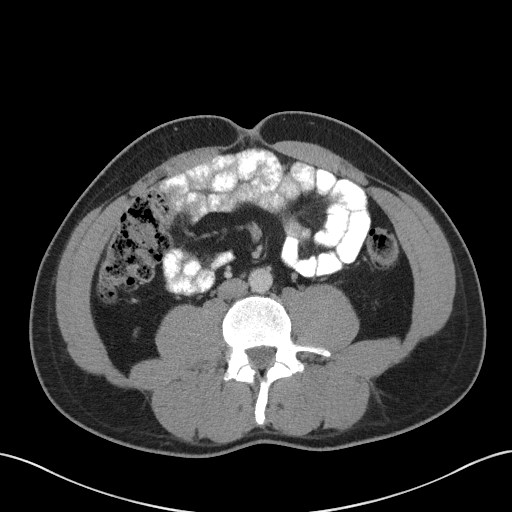
[im 63/104  soft-tissue]
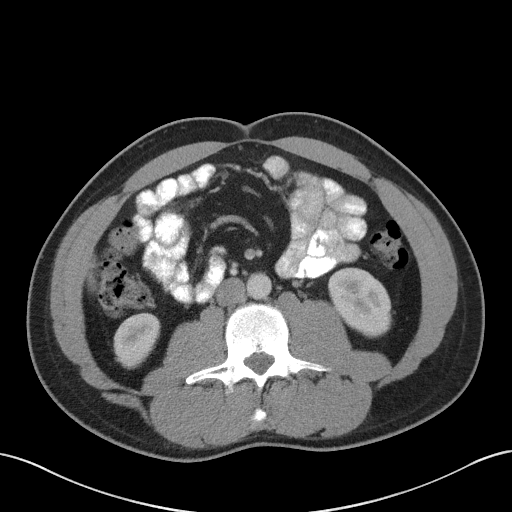
[im 75/104  soft-tissue]
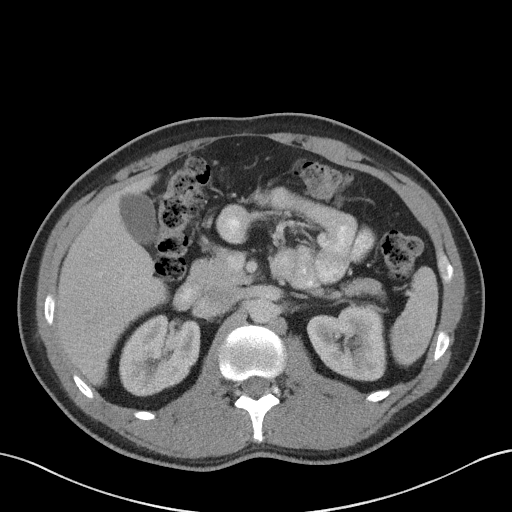
[im 75/104  bone]
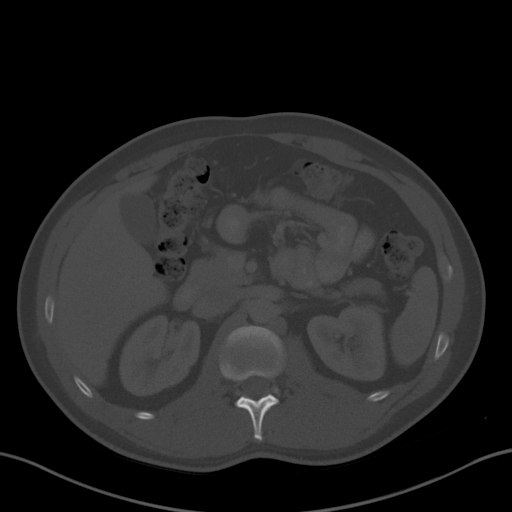
[im 81/104  soft-tissue]
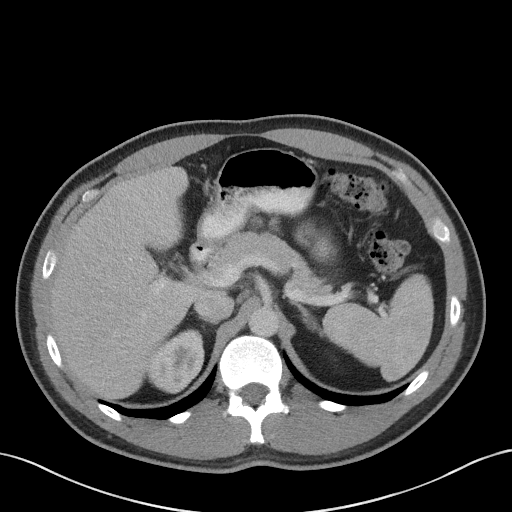
[im 86/104  soft-tissue]
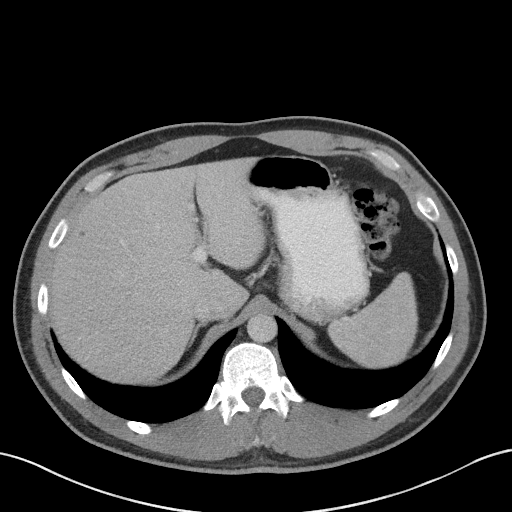
[im 98/104  soft-tissue]
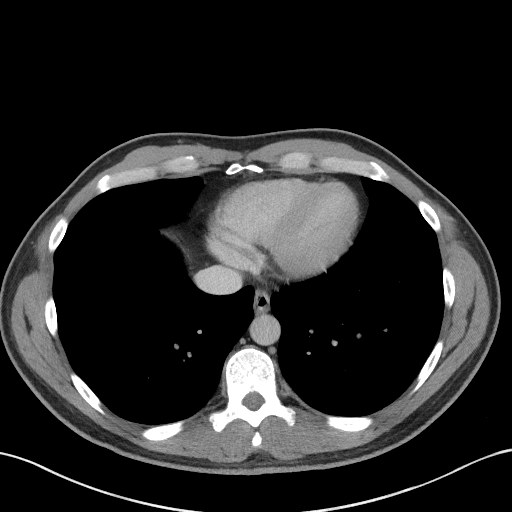

[Series 5: coronal st · coronal · 0.75mm/px · 3 of 95 slices shown]
[im 32/95  soft-tissue]
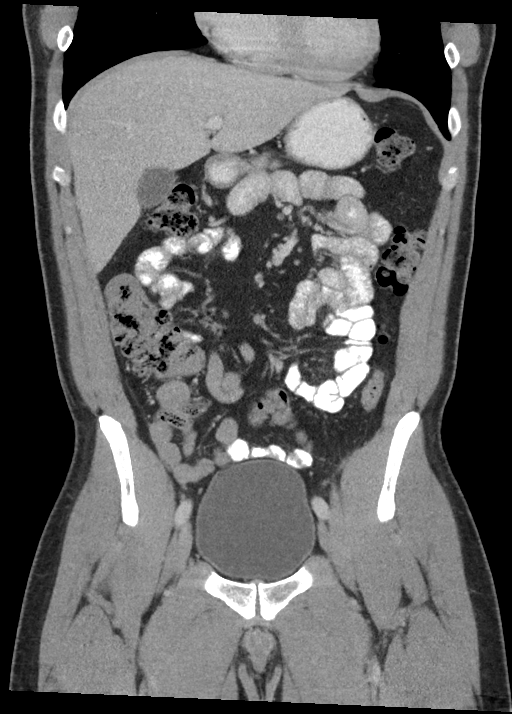
[im 42/95  soft-tissue]
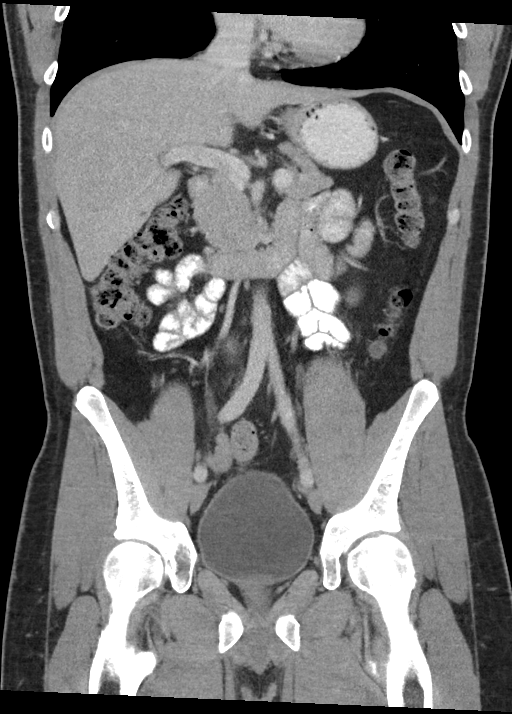
[im 53/95  soft-tissue]
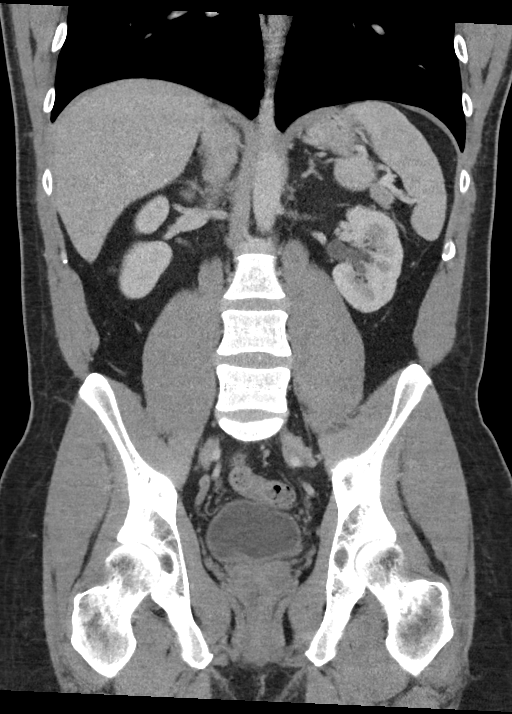

[15 of 46 positions shown; findings below may reference images not displayed]

FINDINGS: Lower chest: The lung bases are clear of acute process. No pleural
effusion or pulmonary lesions. The heart is normal in size. No
pericardial effusion. The distal esophagus and aorta are
unremarkable.

Hepatobiliary: Stable small low-attenuation liver lesions most
consistent with benign hepatic cysts. No worrisome hepatic lesions
or intrahepatic biliary dilatation. The gallbladder is normal. No
common bile duct dilatation.

Pancreas: No mass, inflammation or ductal dilatation.

Spleen: Normal size.  No focal lesions.

Adrenals/Urinary Tract: The adrenal glands and kidneys are
unremarkable. No renal, ureteral or bladder calculi or mass.

Stomach/Bowel: The stomach, duodenum, small bowel and colon are
unremarkable. No acute inflammatory changes, mass lesions or
obstructive findings. The terminal ileum is normal. The appendix is
surgically absent. No findings for recurrent abscess.

Vascular/Lymphatic: The aorta is normal in caliber. No dissection.
The branch vessels are patent. The major venous structures are
patent. No mesenteric or retroperitoneal mass or adenopathy. Small
scattered lymph nodes are noted.

Reproductive: The prostate gland and seminal vesicles are
unremarkable.

Other: No pelvic mass or adenopathy. No free pelvic fluid
collections. No inguinal mass or adenopathy. No abdominal wall
hernia or subcutaneous lesions.

Musculoskeletal: No significant bony findings.
IMPRESSION: 1. No acute abdominal/pelvic findings, mass lesions or adenopathy.
2. Status post appendectomy and drainage of a appendiceal abscess.
No findings for recurrent abscess.
3. Stable small low-attenuation liver lesions consistent with benign
cysts.
